# Patient Record
Sex: Male | Born: 1990 | Race: White | Hispanic: No | Marital: Single | Smoking: Never smoker
Health system: Southern US, Community
[De-identification: ages and names within clinical notes are randomized; demographics above are authoritative.]

## PROBLEM LIST (undated history)

## (undated) HISTORY — PX: OTHER SURGICAL HISTORY: SHX169

---
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
## 2010-08-29 ENCOUNTER — Ambulatory Visit: Payer: Self-pay | Admitting: Internal Medicine

## 2014-09-22 ENCOUNTER — Emergency Department
Admission: EM | Admit: 2014-09-22 | Discharge: 2014-09-22 | Disposition: A | Discharge status: Home / Self Care | Payer: Medicaid Other | Attending: Emergency Medicine | Admitting: Emergency Medicine

## 2014-09-22 ENCOUNTER — Encounter: Payer: Self-pay | Admitting: Emergency Medicine

## 2014-09-22 DIAGNOSIS — F329 Major depressive disorder, single episode, unspecified: Secondary | ICD-10-CM | POA: Insufficient documentation

## 2014-09-22 DIAGNOSIS — F32A Depression, unspecified: Secondary | ICD-10-CM

## 2014-09-22 DIAGNOSIS — F1010 Alcohol abuse, uncomplicated: Secondary | ICD-10-CM | POA: Insufficient documentation

## 2014-09-22 LAB — CBC
HEMATOCRIT: 46.1 % (ref 40.0–52.0)
HEMOGLOBIN: 15.2 g/dL (ref 13.0–18.0)
MCH: 28.9 pg (ref 26.0–34.0)
MCHC: 33.1 g/dL (ref 32.0–36.0)
MCV: 87.3 fL (ref 80.0–100.0)
Platelets: 314 10*3/uL (ref 150–440)
RBC: 5.28 MIL/uL (ref 4.40–5.90)
RDW: 12.8 % (ref 11.5–14.5)
WBC: 8.7 10*3/uL (ref 3.8–10.6)

## 2014-09-22 LAB — COMPREHENSIVE METABOLIC PANEL
ALT: 22 U/L (ref 17–63)
AST: 23 U/L (ref 15–41)
Albumin: 5.1 g/dL — ABNORMAL HIGH (ref 3.5–5.0)
Alkaline Phosphatase: 55 U/L (ref 38–126)
Anion gap: 10 (ref 5–15)
BILIRUBIN TOTAL: 1 mg/dL (ref 0.3–1.2)
BUN: 11 mg/dL (ref 6–20)
CALCIUM: 9.3 mg/dL (ref 8.9–10.3)
CHLORIDE: 104 mmol/L (ref 101–111)
CO2: 26 mmol/L (ref 22–32)
CREATININE: 1.01 mg/dL (ref 0.61–1.24)
Glucose, Bld: 108 mg/dL — ABNORMAL HIGH (ref 65–99)
Potassium: 3.9 mmol/L (ref 3.5–5.1)
Sodium: 140 mmol/L (ref 135–145)
Total Protein: 8.5 g/dL — ABNORMAL HIGH (ref 6.5–8.1)

## 2014-09-22 LAB — ETHANOL: ALCOHOL ETHYL (B): 134 mg/dL — AB (ref <5)

## 2014-09-22 LAB — ACETAMINOPHEN LEVEL

## 2014-09-22 LAB — SALICYLATE LEVEL: Salicylate Lvl: <4 mg/dL (ref 2.8–30.0)

## 2014-09-22 NOTE — ED Provider Notes (Signed)
[facility] Emergency Department Provider Note  ____________________________________________  Time seen: 6:05 AM  I have reviewed the triage vital signs and the nursing notes.   HISTORY  Chief Complaint Psychiatric Evaluation  History obtained by patient and police officer  HPI Bradley Dickerson is a 24 y.o. male who is brought to the ED under involuntary commitment by police due to suicidal ideation.  According to police, the patient was pulled over due to driving with nonfunctioning taillights. However, when he approached the vehicle they realized that the patient was intoxicated with an open container of beer on hand that he was drinking from. In the process of arresting him for a DUI, the patient made multiple statements about "not wanting to be here", and stating that he would not be in court because he would kill himself before then. The patient reports a prior history of suicide attempt but will not divulge the manner in which he tried to harm himself in the past.  The patient himself is not forthcoming with history. He states "I just want to get the f--- out of here".  Review of available records reveals that the patient was seen in an emergency department 06/25/2013 for suicidal ideation and history of self-injurious behavior and was recommended for admission to inpatient psychiatry at that time.   No past medical history on file. [employer] Patient denies any past medical history There are no active problems to display for this patient.   No past surgical history on file. Patient denies No current outpatient prescriptions on file. Patient denies Allergies Review of patient's allergies indicates no known allergies. Patient denies History reviewed. No pertinent family history.  Social History History  Substance Use Topics  . Smoking status: Never Smoker   . Smokeless tobacco: Not on file  . Alcohol Use: Not on file    Review of  Systems  Constitutional: No fever or chills. No weight changes Eyes:No blurry vision or double vision.  ENT: No sore throat. Cardiovascular: No chest pain. Respiratory: No dyspnea or cough. Gastrointestinal: Negative for abdominal pain, vomiting and diarrhea.  No BRBPR or melena. Genitourinary: Negative for dysuria, urinary retention, bloody urine, or difficulty urinating. Musculoskeletal: Negative for back pain. No joint swelling or pain. Skin: Negative for rash. Neurological: Negative for headaches, focal weakness or numbness. Psychiatric:No anxiety or depression.   Endocrine:No hot/cold intolerance, changes in energy, or sleep difficulty.  10-point ROS otherwise negative.  ____________________________________________   PHYSICAL EXAM:  VITAL SIGNS: ED Triage Vitals  Enc Vitals Group     BP 09/22/14 0538 120/79 mmHg     Pulse Rate 09/22/14 0538 86     Resp 09/22/14 0538 18     Temp 09/22/14 0538 98 F (36.7 C)     Temp src --      SpO2 09/22/14 0538 96 %     Weight --      Height --      Head Cir --      Peak Flow --      Pain Score --      Pain Loc --      Pain Edu? --      Excl. in GC? --      Constitutional: Alert and oriented. Well appearing and in no distress. Eyes: No scleral icterus. No conjunctival pallor. PERRL. EOMI ENT   Head: Normocephalic and atraumatic.   Nose: No congestion/rhinnorhea. No septal hematoma   Mouth/Throat: MMM, no pharyngeal erythema. No peritonsillar mass.  No uvula shift.   Neck: No stridor. No SubQ emphysema. No meningismus. Hematological/Lymphatic/Immunilogical: No cervical lymphadenopathy. Cardiovascular: RRR. Normal and symmetric distal pulses are present in all extremities. No murmurs, rubs, or gallops. Respiratory: Normal respiratory effort without tachypnea nor retractions. Breath sounds are clear and equal bilaterally. No wheezes/rales/rhonchi. Gastrointestinal: Soft and nontender. No distention. There is no CVA  tenderness.  No rebound, rigidity, or guarding. Genitourinary: deferred Musculoskeletal: Nontender with normal range of motion in all extremities. No joint effusions.  No lower extremity tenderness.  No edema. Neurologic:   Normal speech and language.  CN 2-10 normal. Motor grossly intact. No pronator drift.  Normal gait. No gross focal neurologic deficits are appreciated.  Skin:  Skin is warm, dry and intact. No rash noted.  No petechiae, purpura, or bullae. Psychiatric: Downtrodden affect.  ____________________________________________    LABS (pertinent positives/negatives) (all labs ordered are listed, but only abnormal results are displayed) Labs Reviewed  COMPREHENSIVE METABOLIC PANEL - Abnormal; Notable for the following:    Glucose, Bld 108 (*)    Total Protein 8.5 (*)    Albumin 5.1 (*)    All other components within normal limits  CBC  ACETAMINOPHEN LEVEL  ETHANOL  SALICYLATE LEVEL  URINE DRUG SCREEN, QUALITATIVE ([facility] ONLY)  URINALYSIS COMPLETEWITH MICROSCOPIC ([facility] ONLY)   ____________________________________________   EKG    ____________________________________________    RADIOLOGY    ____________________________________________   PROCEDURES  ____________________________________________   INITIAL IMPRESSION / ASSESSMENT AND PLAN / ED COURSE  Pertinent labs & imaging results that were available during my care of the patient were reviewed by me and considered in my medical decision making (see chart for details).  Patient presents with suicidal ideation without a stated plan. However, the patient is high risk due to engaging in risky behaviors including driving while intoxicated, and history of suicide attempt, and history of military activity and increased comfort with and access to firearms. We will continue the involuntary commitment [place] awaiting psychiatric evaluation. Patient has no acute  medical complaints and appears medically stable at this time with normal vital signs.  ____________________________________________   FINAL CLINICAL IMPRESSION(S) / ED DIAGNOSES  Final diagnoses:  Alcohol abuse  Depression      Sharman CheekPhillip Annalysse Shoemaker, MD 09/22/14 [phone]

## 2014-09-22 NOTE — ED Notes (Signed)
BEHAVIORAL HEALTH ROUNDING Patient sleeping: Yes.   Patient alert and oriented: pt sleeping Behavior appropriate: Yes.  ; If no, describe:  Nutrition and fluids offered: Yes  Toileting and hygiene offered: Yes  Sitter present: no Law enforcement present: Yes ODS

## 2014-09-22 NOTE — ED Notes (Signed)
BEHAVIORAL HEALTH ROUNDING Patient sleeping: No. Patient alert and oriented: yes Behavior appropriate: Yes.  ; If no, describe:  Nutrition and fluids offered: Yes  Toileting and hygiene offered: Yes  Sitter present: no Law enforcement present: Yes ODS 

## 2014-09-22 NOTE — ED Notes (Signed)
Patient brought in by [place] [employer] for IVC. Patient verbalized thoughts of wanting to hurt himself to [employer]. Patient with previous suicide attempt.

## 2014-09-22 NOTE — ED Notes (Signed)
Visitor with pt.

## 2014-09-22 NOTE — ED Provider Notes (Signed)
-----------------------------------------   4:07 PM on 09/22/2014 -----------------------------------------  Psychiatry has seen patient. They have rescinded the IVC. Patient will follow-up with [facility].  Phineas SemenGraydon Lauri Purdum, MD 09/22/14 [phone]

## 2014-09-22 NOTE — ED Notes (Signed)
Pt reports he has been treated for depression in the past but never on medications. Pt was stopped for a broken tail light by the [employer] and noted to be intoxicated and had an open bottle of beer in the car. Pt upon being arrested and transported to jail made several statements to the officer indicating he did not feel like he had anything to live for and that he would "not be here in 30 days".

## 2014-09-22 NOTE — ED Notes (Signed)
Pt father in with pt visiting

## 2014-09-22 NOTE — Discharge Instructions (Signed)
Please seek medical attention and help for any thoughts about wanting to harm herself, harm others, any concerning change in behavior, severe depression, inappropriate drug use or any other new or concerning symptoms. ° °Alcohol Use Disorder °Alcohol use disorder is a mental disorder. It is not a one-time incident of heavy drinking. Alcohol use disorder is the excessive and uncontrollable use of alcohol over time that leads to problems with functioning in one or more areas of daily living. People with this disorder risk harming themselves and others when they drink to excess. Alcohol use disorder also can cause other mental disorders, such as mood and anxiety disorders, and serious physical problems. People with alcohol use disorder often misuse other drugs.  °Alcohol use disorder is common and widespread. Some people with this disorder drink alcohol to cope with or escape from negative life events. Others drink to relieve chronic pain or symptoms of mental illness. People with a family history of alcohol use disorder are at higher risk of losing control and using alcohol to excess.  °SYMPTOMS  °Signs and symptoms of alcohol use disorder may include the following:  °1. Consumption of alcohol in larger amounts or over a longer period of time than intended. °2. Multiple unsuccessful attempts to cut down or control alcohol use.   °3. A great deal of time spent obtaining alcohol, using alcohol, or recovering from the effects of alcohol (hangover). °4. A strong desire or urge to use alcohol (cravings).   °5. Continued use of alcohol despite problems at work, school, or home because of alcohol use.   °6. Continued use of alcohol despite problems in relationships because of alcohol use. °7. Continued use of alcohol in situations when it is physically hazardous, such as driving a car. °8. Continued use of alcohol despite awareness of a physical or psychological problem that is likely related to alcohol use. Physical  problems related to alcohol use can involve the brain, heart, liver, stomach, and intestines. Psychological problems related to alcohol use include intoxication, depression, anxiety, psychosis, delirium, and dementia.   °9. The need for increased amounts of alcohol to achieve the same desired effect, or a decreased effect from the consumption of the same amount of alcohol (tolerance). °10. Withdrawal symptoms upon reducing or stopping alcohol use, or alcohol use to reduce or avoid withdrawal symptoms. Withdrawal symptoms include: °1. Racing heart. °2. Hand tremor. °3. Difficulty sleeping. °4. Nausea. °5. Vomiting. °6. Hallucinations. °7. Restlessness. °8. Seizures. °DIAGNOSIS °Alcohol use disorder is diagnosed through an assessment by your health care provider. Your health care provider may start by asking three or four questions to screen for excessive or problematic alcohol use. To confirm a diagnosis of alcohol use disorder, at least two symptoms must be present within a 12-month period. The severity of alcohol use disorder depends on the number of symptoms: °· Mild--two or three. °· Moderate--four or five. °· Severe--six or more. °Your health care provider may perform a physical exam or use results from lab tests to see if you have physical problems resulting from alcohol use. Your health care provider may refer you to a mental health professional for evaluation. °TREATMENT  °Some people with alcohol use disorder are able to reduce their alcohol use to low-risk levels. Some people with alcohol use disorder need to quit drinking alcohol. When necessary, mental health professionals with specialized training in substance use treatment can help. Your health care provider can help you decide how severe your alcohol use disorder is and what type of treatment you need. The following forms of   treatment are available:  °· Detoxification. Detoxification involves the use of prescription medicines to prevent alcohol  withdrawal symptoms in the first week after quitting. This is important for people with a history of symptoms of withdrawal and for heavy drinkers who are likely to have withdrawal symptoms. Alcohol withdrawal can be dangerous and, in severe cases, cause death. Detoxification is usually provided in a hospital or in-patient substance use treatment facility. °· Counseling or talk therapy. Talk therapy is provided by substance use treatment counselors. It addresses the reasons people use alcohol and ways to keep them from drinking again. The goals of talk therapy are to help people with alcohol use disorder find healthy activities and ways to cope with life stress, to identify and avoid triggers for alcohol use, and to handle cravings, which can cause relapse. °· Medicines. Different medicines can help treat alcohol use disorder through the following actions: °· Decrease alcohol cravings. °· Decrease the positive reward response felt from alcohol use. °· Produce an uncomfortable physical reaction when alcohol is used (aversion therapy). °· Support groups. Support groups are run by people who have quit drinking. They provide emotional support, advice, and guidance. °These forms of treatment are often combined. Some people with alcohol use disorder benefit from intensive combination treatment provided by specialized substance use treatment centers. Both inpatient and outpatient treatment programs are available. °Document Released: 05/14/2004 Document Revised: 08/21/2013 Document Reviewed: 07/14/2012 °ExitCare® Patient Information ©2015 [employer]. This information is not intended to replace advice given to you by your health care provider. Make sure you discuss any questions you have with your health care provider. ° °Depression °Depression refers to feeling sad, low, down in the dumps, blue, gloomy, or empty. In general, there are two kinds of depression: °11. Normal sadness or normal grief. This kind of depression  is one that we all feel from time to time after upsetting life experiences, such as the loss of a job or the ending of a relationship. This kind of depression is considered normal, is short lived, and resolves within a few days to 2 weeks. Depression experienced after the loss of a loved one (bereavement) often lasts longer than 2 weeks but normally gets better with time. °12. Clinical depression. This kind of depression lasts longer than normal sadness or normal grief or interferes with your ability to function at home, at work, and in school. It also interferes with your personal relationships. It affects almost every aspect of your life. Clinical depression is an illness. °Symptoms of depression can also be caused by conditions other than those mentioned above, such as: °· Physical illness. Some physical illnesses, including underactive thyroid gland (hypothyroidism), severe anemia, specific types of cancer, diabetes, uncontrolled seizures, heart and lung problems, strokes, and chronic pain are commonly associated with symptoms of depression. °· Side effects of some prescription medicine. In some people, certain types of medicine can cause symptoms of depression. °· Substance abuse. Abuse of alcohol and illicit drugs can cause symptoms of depression. °SYMPTOMS °Symptoms of normal sadness and normal grief include the following: °· Feeling sad or crying for short periods of time. °· Not caring about anything (apathy). °· Difficulty sleeping or sleeping too much. °· No longer able to enjoy the things you used to enjoy. °· Desire to be by oneself all the time (social isolation). °· Lack of energy or motivation. °· Difficulty concentrating or remembering. °· Change in appetite or weight. °· Restlessness or agitation. °Symptoms of clinical depression include the same symptoms of normal   sadness or normal grief and also the following symptoms: °· Feeling sad or crying all the time. °· Feelings of guilt or  worthlessness. °· Feelings of hopelessness or helplessness. °· Thoughts of suicide or the desire to harm yourself (suicidal ideation). °· Loss of touch with reality (psychotic symptoms). Seeing or hearing things that are not real (hallucinations) or having false beliefs about your life or the people around you (delusions and paranoia). °DIAGNOSIS  °The diagnosis of clinical depression is usually based on how bad the symptoms are and how long they have lasted. Your health care provider will also ask you questions about your medical history and substance use to find out if physical illness, use of prescription medicine, or substance abuse is causing your depression. Your health care provider may also order blood tests. °TREATMENT  °Often, normal sadness and normal grief do not require treatment. However, sometimes antidepressant medicine is given for bereavement to ease the depressive symptoms until they resolve. °The treatment for clinical depression depends on how bad the symptoms are but often includes antidepressant medicine, counseling with a mental health professional, or both. Your health care provider will help to determine what treatment is best for you. °Depression caused by physical illness usually goes away with appropriate medical treatment of the illness. If prescription medicine is causing depression, talk with your health care provider about stopping the medicine, decreasing the dose, or changing to another medicine. °Depression caused by the abuse of alcohol or illicit drugs goes away when you stop using these substances. Some adults need professional help in order to stop drinking or using drugs. °SEEK IMMEDIATE MEDICAL CARE IF: °· You have thoughts about hurting yourself or others. °· You lose touch with reality (have psychotic symptoms). °· You are taking medicine for depression and have a serious side effect. °FOR MORE INFORMATION °· National Alliance on Mental Illness: www.nami.org  °· National  Institute of Mental Health: www.nimh.nih.gov  °Document Released: 04/03/2000 Document Revised: 08/21/2013 Document Reviewed: 07/06/2011 °ExitCare® Patient Information ©2015 [employer]. This information is not intended to replace advice given to you by your health care provider. Make sure you discuss any questions you have with your health care provider. ° °

## 2014-09-22 NOTE — ED Notes (Signed)
BEHAVIORAL HEALTH ROUNDING  Patient sleeping: No.  Patient alert and oriented: yes  Behavior appropriate: Yes. ; If no, describe:  Nutrition and fluids offered: Yes  Toileting and hygiene offered: Yes  Sitter present: not applicable  Law enforcement present: Yes ODS  

## 2014-09-22 NOTE — BH Assessment (Signed)
Assessment Note  Bradley Dickerson is an 24 y.o. male Who presents to ER due to reporting SI, after being pulled over by laws enforcement. Pt. was charged with DWI during that time. However, is currently denying SI. He is stating he wants to be discharged from the ER with no follow up appointments or outpatient treatment. According to him, he has no problem with alcohol, due to reducing his use for the last 4 weeks. He further reports he use to drink 4 to 5 days a week, in the amount of 9 beers and 3 shots. Now he is drinking once a week, in the amount of 4, 12oz beers and 1 shot.  Pt. states, his "God Sister" was in the car, during the time he was pulled over. She didn't obtain any charges. She was "let go" and someone picked her up the location of the DWI.  Upon arrival to the ER, pt.  BAC was 134. UDS wasn't available during the time of the assessment.  Pt. also denies any involvement with the legal system, except the DWI he obtained prior to coming to the ER. He has been Inpt for his alcohol use, approximately 2 years ago. He was at [facility]. He denies any other treatment for Substance use and Mental Health.   Pt. currently denies SI/HI  Records indicate pt. has Medicaid. Writer called [employer] to verify it and to also check whether or not he is receiving Mental Health/Substance Abuse services. Pt. has not Medicaid and no formal treatment.  Axis I: Alcohol Abuse and Depressive Disorder NOS Axis III: No past medical history on file. Axis IV: economic problems, occupational problems, other psychosocial or environmental problems, problems related to legal system/crime, problems related to social environment and problems with primary support group  Past Medical History: No past medical history on file.  No past surgical history on file.  Family History: History reviewed. No pertinent family history.  Social History:  reports that he has never smoked. He does not have any  smokeless tobacco history on file. He reports that he does not use illicit drugs. His alcohol history is not on file.  Additional Social History:  Alcohol / Drug Use Pain Medications: None Reported Prescriptions: None Reported Over the Counter: None Reported History of alcohol / drug use?: Yes Longest period of sobriety (when/how long): 6 days Negative Consequences of Use: Legal Withdrawal Symptoms:  (None Reported) Substance #1 Name of Substance 1: Alcohol 1 - Age of First Use: 11 1 - Amount (size/oz): 1 shot and 4-5 (12oz) beers 1 - Frequency: Once a week 1 - Duration: Approx. 5 years 1 - Last Use / Amount: 09/22/2014  CIWA: CIWA-Ar BP: (!) 114/57 mmHg Pulse Rate: 81 COWS:    Allergies: No Known Allergies  Home Medications:  (Not in a hospital admission)  OB/GYN Status:  No LMP for male patient.  General Assessment Data Location of Assessment: [facility] ED TTS Assessment: In system Is this a Tele or Face-to-Face Assessment?: Face-to-Face Is this an Initial Assessment or a Re-assessment for this encounter?: Initial Assessment Marital status: Single Maiden name: N/A Is patient pregnant?: No (N/A) Pregnancy Status: No Living Arrangements: Non-relatives/Friends Can pt return to current living arrangement?: Yes Admission Status: Involuntary Is patient capable of signing voluntary admission?: No Referral Source: Other Mudlogger) Insurance type: Medicaid  Medical Screening Exam [facility] Walk-in ONLY) Medical Exam completed: Yes  Crisis Care Plan Living Arrangements: Non-relatives/Friends Name of Psychiatrist: N/A Name of Therapist: N/A  Education  Status Is patient currently in school?: No Current Grade: N/A Highest grade of school patient has completed: Unknown Name of school: N/A Contact person: N/A  Risk to self with the past 6 months Suicidal Ideation: No (While intoxicated pt reported SI) Has patient been a risk to self within the past 6 months prior to  admission? : Other (comment) (Pt reports driving while intoxicated) Suicidal Intent: No Has patient had any suicidal intent within the past 6 months prior to admission? : No Is patient at risk for suicide?: No Suicidal Plan?: No Has patient had any suicidal plan within the past 6 months prior to admission? : No Access to Means: No What has been your use of drugs/alcohol within the last 12 months?: Alcohol Previous Attempts/Gestures: No How many times?: 0 Other Self Harm Risks: 0 Triggers for Past Attempts: None known Intentional Self Injurious Behavior: None Family Suicide History: Unknown Recent stressful life event(s): Legal Issues Persecutory voices/beliefs?: No Depression: Yes Depression Symptoms: Feeling angry/irritable, Feeling worthless/self pity, Guilt Substance abuse history and/or treatment for substance abuse?: Yes Suicide prevention information given to non-admitted patients: Not applicable  Risk to Others within the past 6 months Homicidal Ideation: No Does patient have any lifetime risk of violence toward others beyond the six months prior to admission? : No Thoughts of Harm to Others: No Current Homicidal Intent: No Current Homicidal Plan: No Access to Homicidal Means: No Identified Victim: None Reported History of harm to others?: No Assessment of Violence: None Noted Violent Behavior Description: None Reported Does patient have access to weapons?: No Criminal Charges Pending?: Yes Describe Pending Criminal Charges: DWI Does patient have a court date: No (Unknown) Is patient on probation?: No  Psychosis Hallucinations: None noted Delusions: None noted  Mental Status Report Appearance/Hygiene: In scrubs, In hospital gown Eye Contact: Good Motor Activity: Unable to assess (Pt was laying in bed) Speech: Logical/coherent Level of Consciousness: Alert Mood: Anxious, Fearful Affect: Appropriate to circumstance, Anxious Anxiety Level: Minimal Thought  Processes: Coherent, Relevant Judgement: Unimpaired Orientation: Person, Place, Time, Situation, Appropriate for developmental age Obsessive Compulsive Thoughts/Behaviors: None  Cognitive Functioning Concentration: Normal Memory: Remote Intact, Recent Impaired IQ: Average Insight: Poor Impulse Control: Poor Appetite: Fair Weight Loss: 0 Weight Gain: 0 Sleep: No Change Total Hours of Sleep: 8 Vegetative Symptoms: None  ADLScreening [facility] Assessment Services) Patient's cognitive ability adequate to safely complete daily activities?: Yes Patient able to express need for assistance with ADLs?: Yes Independently performs ADLs?: Yes (appropriate for developmental age)  Prior Inpatient Therapy Prior Inpatient Therapy: Yes Prior Therapy Dates: 2014 [facility] Reason for Treatment: Alcohol Use  Prior Outpatient Therapy Prior Outpatient Therapy: No Prior Therapy Dates: n/a Prior Therapy Facilty/Provider(s): n/a Reason for Treatment: n/a Does patient have an ACCT team?: No Does patient have Intensive In-House Services?  : No Does patient have [facility] services? : No Does patient have P4CC services?: No  ADL Screening (condition at time of admission) Patient's cognitive ability adequate to safely complete daily activities?: Yes Patient able to express need for assistance with ADLs?: Yes Independently performs ADLs?: Yes (appropriate for developmental age)       Abuse/Neglect Assessment (Assessment to be complete while patient is alone) Physical Abuse: Denies Verbal Abuse: Denies Sexual Abuse: Denies Exploitation of patient/patient's resources: Denies Self-Neglect: Denies Values / Beliefs Cultural Requests During Hospitalization: None Spiritual Requests During Hospitalization: None Consults Spiritual Care Consult Needed: No Social Work Consult Needed: No Merchant navy officerAdvance Directives (For Healthcare) Does patient have an advance  directive?:  No Would patient like information on creating an advanced directive?: Yes - Educational materials given    Additional Information 1:1 In Past 12 Months?: No CIRT Risk: No Elopement Risk: No Does patient have medical clearance?: Yes  Child/Adolescent Assessment Running Away Risk: Denies (Pt. is an Adult)  Disposition:  Disposition Initial Assessment Completed for this Encounter: Yes Disposition of Patient: Other dispositions (Psych MD to see) Other disposition(s): Other (Comment) (Psych MD to see)  On Site Evaluation by:   Reviewed with Physician:     Lilyan Gilford, MS, LCAS, LPC, NCC, CCSI 09/22/2014 1:21 PM

## 2014-09-22 NOTE — Consult Note (Signed)
[facility] Consult   Reason for Consult:  Follow up Referring Physician:  ER Patient Identification: Bradley Dickerson MRN:  [id number] Principal Diagnosis: <principal problem not specified> Diagnosis:  There are no active problems to display for this patient.   Total Time spent with patient: 45 minutes  Subjective:   Bradley Dickerson is a 24 y.o. 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 HPI Elements:     Past Medical History: No past medical history on file. No past surgical history on file. Family History: History reviewed. No pertinent family history. Social History:  History  Alcohol Use: Not on file     History  Drug Use No    History   Social History  . Marital Status: Single    Spouse Name: N/A  . Number of Children: N/A  . Years of Education: N/A   Social History Main Topics  . Smoking status: Never Smoker   . Smokeless tobacco: Not on file  . Alcohol Use: Not on file  . Drug Use: No  . Sexual Activity: Not on file   Other Topics Concern  . None   Social History Narrative  . None   Additional Social History:    Pain Medications: None Reported Prescriptions: None Reported Over the Counter: None Reported History of alcohol / drug use?: Yes Longest period of sobriety (when/how long): 6 days Negative Consequences of Use: Legal Withdrawal Symptoms:  (None Reported) Name of Substance 1: Alcohol 1 - Age of First Use: 11 1 - Amount (size/oz): 1 shot and 4-5 (12oz) beers 1 - Frequency: Once a week 1 - Duration: Approx. 5 years 1 - Last Use / Amount: 09/22/2014                   Allergies:  No Known Allergies  Labs:  Results for orders placed or performed during the hospital encounter of 09/22/14 (from the past  48 hour(s))  Acetaminophen level     Status: Abnormal   Collection Time: 09/22/14  5:43 AM  Result Value Ref Range   Acetaminophen (Tylenol), Serum <10 (L) 10 - 30 ug/mL    Comment:        THERAPEUTIC CONCENTRATIONS VARY SIGNIFICANTLY. A RANGE OF 10-30 ug/mL MAY BE AN EFFECTIVE CONCENTRATION FOR MANY PATIENTS. HOWEVER, SOME ARE BEST TREATED AT CONCENTRATIONS OUTSIDE THIS RANGE. ACETAMINOPHEN CONCENTRATIONS >150 ug/mL AT 4 HOURS AFTER INGESTION AND >50 ug/mL AT 12 HOURS AFTER INGESTION ARE OFTEN ASSOCIATED WITH TOXIC REACTIONS.   CBC     Status: None   Collection Time: 09/22/14  5:43 AM  Result Value Ref Range   WBC 8.7 3.8 - 10.6 K/uL   RBC 5.28 4.40 - 5.90 MIL/uL   Hemoglobin 15.2 13.0 - 18.0 g/dL   HCT 46.1 40.0 - 52.0 %   MCV 87.3 80.0 - 100.0 fL   MCH 28.9 26.0 - 34.0 pg   MCHC 33.1 32.0 - 36.0 g/dL   RDW 12.8 11.5 - 14.5 %   Platelets 314 150 - 440 K/uL  Comprehensive metabolic panel     Status: Abnormal   Collection Time: 09/22/14  5:43 AM  Result Value Ref Range   Sodium 140 135 - 145 mmol/L  Potassium 3.9 3.5 - 5.1 mmol/L   Chloride 104 101 - 111 mmol/L   CO2 26 22 - 32 mmol/L   Glucose, Bld 108 (H) 65 - 99 mg/dL   BUN 11 6 - 20 mg/dL   Creatinine, Ser 1.01 0.61 - 1.24 mg/dL   Calcium 9.3 8.9 - 10.3 mg/dL   Total Protein 8.5 (H) 6.5 - 8.1 g/dL   Albumin 5.1 (H) 3.5 - 5.0 g/dL   AST 23 15 - 41 U/L   ALT 22 17 - 63 U/L   Alkaline Phosphatase 55 38 - 126 U/L   Total Bilirubin 1.0 0.3 - 1.2 mg/dL   GFR calc non Af Amer >60 >60 mL/min   GFR calc Af Amer >60 >60 mL/min    Comment: (NOTE) The eGFR has been calculated using the CKD EPI equation. This calculation has not been validated in all clinical situations. eGFR's persistently <60 mL/min signify possible Chronic Kidney Disease.    Anion gap 10 5 - 15  Ethanol (ETOH)     Status: Abnormal   Collection Time: 09/22/14  5:43 AM  Result Value Ref Range   Alcohol, Ethyl (B) 134 (H) <5 mg/dL    Comment:         LOWEST DETECTABLE LIMIT FOR SERUM ALCOHOL IS 11 mg/dL FOR MEDICAL PURPOSES ONLY   Salicylate level     Status: None   Collection Time: 09/22/14  5:43 AM  Result Value Ref Range   Salicylate Lvl <6.3 2.8 - 30.0 mg/dL    Vitals: Blood pressure 114/57, pulse 81, temperature 97.8 F (36.6 C), temperature source Oral, resp. rate 18, SpO2 97 %.  Risk to Self: Suicidal Ideation: No (While intoxicated pt reported SI) Suicidal Intent: No Is patient at risk for suicide?: No Suicidal Plan?: No Access to Means: No What has been your use of drugs/alcohol within the last 12 months?: Alcohol How many times?: 0 Other Self Harm Risks: 0 Triggers for Past Attempts: None known Intentional Self Injurious Behavior: None Risk to Others: Homicidal Ideation: No Thoughts of Harm to Others: No Current Homicidal Intent: No Current Homicidal Plan: No Access to Homicidal Means: No Identified Victim: None Reported History of harm to others?: No Assessment of Violence: None Noted Violent Behavior Description: None Reported Does patient have access to weapons?: No Criminal Charges Pending?: Yes Describe Pending Criminal Charges: DWI Does patient have a court date: No (Unknown) Prior Inpatient Therapy: Prior Inpatient Therapy: Yes Prior Therapy Dates: 2014 Prior Therapy Facilty/Provider(s): [facility] Reason for Treatment: Alcohol Use Prior Outpatient Therapy: Prior Outpatient Therapy: No Prior Therapy Dates: n/a Prior Therapy Facilty/Provider(s): n/a Reason for Treatment: n/a Does patient have an ACCT team?: No Does patient have Intensive In-House Services?  : No Does patient have [facility] services? : No Does patient have P4CC services?: No  No current facility-administered medications for this encounter.   No current outpatient prescriptions on file.    Musculoskeletal: Strength & Muscle Tone: within normal limits Gait & Station: normal Patient leans: N/A  Psychiatric Specialty  Exam: Physical Exam  Review of Systems  Constitutional: Negative.   HENT: Negative.   Eyes: Negative.   Respiratory: Negative.   Cardiovascular: Negative.   Gastrointestinal: Negative.   Genitourinary: Negative.   Musculoskeletal: Negative.   Skin: Negative.   Neurological: Negative.   Endo/Heme/Allergies: Negative.   Psychiatric/Behavioral: Positive for substance abuse.    Blood pressure 114/57, pulse 81, temperature 97.8 F (36.6 C), temperature source Oral, resp. rate 18, SpO2 97 %.There  is no height or weight on file to calculate BMI.  General Appearance: Casual  Eye Contact::  Good  Speech:  Clear and Coherent  Volume:  Normal  Mood:  Anxious  Affect:  Appropriate  Thought Process:  Circumstantial  Orientation:  Full (Time, Place, and Person)  Thought Content:  Negative  Suicidal Thoughts:  No  Homicidal Thoughts:  No  Memory:  Good and intact.  Judgement:  Fair  Insight:  Fair  Psychomotor Activity:  Normal  Concentration:  Good  Recall:  [employer] of Knowledge:Fair  Language: Fair  Akathisia:  No  Handed:  Right  AIMS (if indicated):     Assets:  Communication Skills  ADL's:  Intact  Cognition: WNL  Sleep:      Medical Decision Making: Established Problem, Stable/Improving (1)  Treatment Plan Summary: Plan \D/C IVC and D/C pt with follow up and help with Substance abuse  Plan:  No evidence of imminent risk to self or others at present.   Disposition: as above  Cannen Dupras K 09/22/2014 1:51 PM

## 2014-09-22 NOTE — ED Notes (Signed)
BEHAVIORAL HEALTH ROUNDING Patient sleeping: Yes.   Patient alert and oriented: yes Behavior appropriate: Yes.  ;  Nutrition and fluids offered: Yes  Toileting and hygiene offered: Yes  Sitter present: yes Law enforcement present: Yes  

## 2014-09-22 NOTE — ED Notes (Signed)

## 2014-09-22 NOTE — ED Notes (Signed)
Psychiatrist in with pt.

## 2014-09-22 NOTE — ED Notes (Signed)
Pt given dinner tray at this time, pt laying in bed not eating.

## 2014-12-31 ENCOUNTER — Emergency Department
Admission: EM | Admit: 2014-12-31 | Discharge: 2014-12-31 | Disposition: A | Discharge status: Home / Self Care | Payer: Medicaid Other | Attending: Emergency Medicine | Admitting: Emergency Medicine

## 2014-12-31 ENCOUNTER — Encounter: Payer: Self-pay | Admitting: Emergency Medicine

## 2014-12-31 DIAGNOSIS — Y9289 Other specified places as the place of occurrence of the external cause: Secondary | ICD-10-CM | POA: Insufficient documentation

## 2014-12-31 DIAGNOSIS — X58XXXA Exposure to other specified factors, initial encounter: Secondary | ICD-10-CM | POA: Insufficient documentation

## 2014-12-31 DIAGNOSIS — S30812A Abrasion of penis, initial encounter: Secondary | ICD-10-CM | POA: Insufficient documentation

## 2014-12-31 DIAGNOSIS — T148XXA Other injury of unspecified body region, initial encounter: Secondary | ICD-10-CM

## 2014-12-31 DIAGNOSIS — Y998 Other external cause status: Secondary | ICD-10-CM | POA: Insufficient documentation

## 2014-12-31 DIAGNOSIS — N4822 Cellulitis of corpus cavernosum and penis: Secondary | ICD-10-CM | POA: Insufficient documentation

## 2014-12-31 DIAGNOSIS — L03818 Cellulitis of other sites: Secondary | ICD-10-CM

## 2014-12-31 DIAGNOSIS — Y9389 Activity, other specified: Secondary | ICD-10-CM | POA: Insufficient documentation

## 2014-12-31 MED ORDER — AZITHROMYCIN 250 MG PO TABS
1000.0000 mg | ORAL_TABLET | Freq: Once | ORAL | Status: AC
  Administered 2014-12-31: 1000 mg via ORAL
  Filled 2014-12-31: qty 4

## 2014-12-31 MED ORDER — MUPIROCIN 2 % EX OINT: TOPICAL_OINTMENT | CUTANEOUS | Status: AC

## 2014-12-31 MED ORDER — CEFTRIAXONE SODIUM 250 MG IJ SOLR
250.0000 mg | INTRAMUSCULAR | Status: DC
  Administered 2014-12-31: 250 mg via INTRAMUSCULAR
  Filled 2014-12-31: qty 250

## 2014-12-31 NOTE — ED Notes (Signed)
States girl friend has been treated for std  Now having discharge

## 2014-12-31 NOTE — Discharge Instructions (Signed)
Abrasion An abrasion is a cut or scrape of the skin. Abrasions do not extend through all layers of the skin and most heal within 10 days. It is important to care for your abrasion properly to prevent infection. CAUSES  Most abrasions are caused by falling on, or gliding across, the ground or other surface. When your skin rubs on something, the outer and inner layer of skin rubs off, causing an abrasion. DIAGNOSIS  Your caregiver will be able to diagnose an abrasion during a physical exam.  TREATMENT  Your treatment depends on how large and deep the abrasion is. Generally, your abrasion will be cleaned with water and a mild soap to remove any dirt or debris. An antibiotic ointment may be put over the abrasion to prevent an infection. A bandage (dressing) may be wrapped around the abrasion to keep it from getting dirty.  You may need a tetanus shot if:  You cannot remember when you had your last tetanus shot.  You have never had a tetanus shot.  The injury broke your skin. If you get a tetanus shot, your arm may swell, get red, and feel warm to the touch. This is common and not a problem. If you need a tetanus shot and you choose not to have one, there is a rare chance of getting tetanus. Sickness from tetanus can be serious.  HOME CARE INSTRUCTIONS   If a dressing was applied, change it at least once a day or as directed by your caregiver. If the bandage sticks, soak it off with warm water.   Wash the area with water and a mild soap to remove all the ointment 2 times a day. Rinse off the soap and pat the area dry with a clean towel.   Reapply any ointment as directed by your caregiver. This will help prevent infection and keep the bandage from sticking. Use gauze over the wound and under the dressing to help keep the bandage from sticking.   Change your dressing right away if it becomes wet or dirty.   Only take over-the-counter or prescription medicines for pain, discomfort, or fever as  directed by your caregiver.   Follow up with your caregiver within 24-48 hours for a wound check, or as directed. If you were not given a wound-check appointment, look closely at your abrasion for redness, swelling, or pus. These are signs of infection. SEEK IMMEDIATE MEDICAL CARE IF:   You have increasing pain in the wound.   You have redness, swelling, or tenderness around the wound.   You have pus coming from the wound.   You have a fever or persistent symptoms for more than 2-3 days.  You have a fever and your symptoms suddenly get worse.  You have a bad smell coming from the wound or dressing.  MAKE SURE YOU:   Understand these instructions.  Will watch your condition.  Will get help right away if you are not doing well or get worse. Document Released: 01/14/2005 Document Revised: 03/23/2012 Document Reviewed: 03/10/2011 [facility] Patient Information 2015 [place], [place]. This information is not intended to replace advice given to you by your health care provider. Make sure you discuss any questions you have with your health care provider.  Cellulitis Cellulitis is an infection of the skin and the tissue under the skin. The infected area is usually red and tender. This happens most often in the arms and lower legs. HOME CARE   Take your antibiotic medicine as told. Finish the medicine  even if you start to feel better.  Keep the infected arm or leg raised (elevated).  Put a warm cloth on the area up to 4 times per day.  Only take medicines as told by your doctor.  Keep all doctor visits as told. GET HELP IF:  You see red streaks on the skin coming from the infected area.  Your red area gets bigger or turns a dark color.  Your bone or joint under the infected area is painful after the skin heals.  Your infection comes back in the same area or different area.  You have a puffy (swollen) bump in the infected area.  You have new symptoms.  You have a  fever. GET HELP RIGHT AWAY IF:   You feel very sleepy.  You throw up (vomit) or have watery poop (diarrhea).  You feel sick and have muscle aches and pains. MAKE SURE YOU:   Understand these instructions.  Will watch your condition.  Will get help right away if you are not doing well or get worse. Document Released: 09/23/2007 Document Revised: 08/21/2013 Document Reviewed: 06/22/2011 [facility] Patient Information 2015 [place], [place]. This information is not intended to replace advice given to you by your health care provider. Make sure you discuss any questions you have with your health care provider.

## 2014-12-31 NOTE — ED Notes (Signed)
States he has been exposed to STD   Positive discharge

## 2014-12-31 NOTE — ED Provider Notes (Signed)
[facility] Emergency Department Provider Note     Time seen: ----------------------------------------- 11:44 AM on 12/31/2014 -----------------------------------------    I have reviewed the triage vital signs and the nursing notes.   HISTORY  Chief Complaint Penile Discharge    HPI Bradley Dickerson is a 24 y.o. male who presents ER for examination concerning possibly having an STD. Patient states that on protected sex, also recently used a condom. Has a red tender lesion on his penile shaft is concerned about. He does not have any dysuria   History reviewed. No pertinent past medical history.  There are no active problems to display for this patient.   History reviewed. No pertinent past surgical history.  Allergies Review of patient's allergies indicates no known allergies.  Social History Social History  Substance Use Topics  . Smoking status: Never Smoker   . Smokeless tobacco: None  . Alcohol Use: None    Review of Systems Constitutional: Negative for fever. Genitourinary: Negative for dysuria. Positive for redness and swelling to the penile shaft Skin: Redness to the penile shaft  ____________________________________________   PHYSICAL EXAM:  VITAL SIGNS: ED Triage Vitals  Enc Vitals Group     BP 12/31/14 1140 146/78 mmHg     Pulse Rate 12/31/14 1140 86     Resp 12/31/14 1140 20     Temp 12/31/14 1140 98.2 F (36.8 C)     Temp Source 12/31/14 1140 Oral     SpO2 12/31/14 1140 99 %     Weight 12/31/14 1140 130 lb (58.968 kg)     Height 12/31/14 1140  (1.676 m)     Head Cir --      Peak Flow --      Pain Score --      Pain Loc --      Pain Edu? --      Excl. in GC? --     Constitutional: Alert and oriented. Well appearing and in no distress. Genitourinary: Patient has small inguinal lymph nodes on the left, there is a red erythematous lesion consistent with abrasion and mild cellulitis to the penile shaft on the  right. Skin:  Abrasion with small area of cellulitis to the penile shaft with slight edema  ____________________________________________  ED COURSE:  Pertinent labs & imaging results that were available during my care of the patient were reviewed by me and considered in my medical decision making (see chart for details). Findings not consistent with syphilis, however consistent with abrasion with likely bacterial infection. He has reactive adenopathy from same patient is requesting coverage for STD. ____________________________________________  FINAL ASSESSMENT AND PLAN  Penile abrasion, cellulitis  Plan: Patient with labs and imaging as dictated above. Patient was given Rocephin and Zithromax to cover for gonorrhea and chlamydia. We placed on Bactroban ointment to apply to his penis. He is advised to follow-up in 2 days for recheck.   Emily Filbert, MD   Emily Filbert, MD 12/31/14 [phone]

## 2016-06-25 ENCOUNTER — Emergency Department: Payer: Self-pay

## 2016-06-25 ENCOUNTER — Emergency Department
Admission: EM | Admit: 2016-06-25 | Discharge: 2016-06-25 | Disposition: A | Discharge status: Home / Self Care | Payer: Self-pay | Attending: Emergency Medicine | Admitting: Emergency Medicine

## 2016-06-25 DIAGNOSIS — K5790 Diverticulosis of intestine, part unspecified, without perforation or abscess without bleeding: Secondary | ICD-10-CM | POA: Insufficient documentation

## 2016-06-25 DIAGNOSIS — R1032 Left lower quadrant pain: Secondary | ICD-10-CM

## 2016-06-25 LAB — COMPREHENSIVE METABOLIC PANEL
ALBUMIN: 4.3 g/dL (ref 3.5–5.0)
ALT: 16 U/L — ABNORMAL LOW (ref 17–63)
AST: 18 U/L (ref 15–41)
Alkaline Phosphatase: 49 U/L (ref 38–126)
Anion gap: 6 (ref 5–15)
BILIRUBIN TOTAL: 0.9 mg/dL (ref 0.3–1.2)
BUN: 13 mg/dL (ref 6–20)
CO2: 27 mmol/L (ref 22–32)
Calcium: 9.1 mg/dL (ref 8.9–10.3)
Chloride: 106 mmol/L (ref 101–111)
Creatinine, Ser: 0.98 mg/dL (ref 0.61–1.24)
GFR calc Af Amer: >60 mL/min (ref >60)
GFR calc non Af Amer: >60 mL/min (ref >60)
GLUCOSE: 87 mg/dL (ref 65–99)
POTASSIUM: 4.3 mmol/L (ref 3.5–5.1)
SODIUM: 139 mmol/L (ref 135–145)
TOTAL PROTEIN: 7.5 g/dL (ref 6.5–8.1)

## 2016-06-25 LAB — CBC
HEMATOCRIT: 47.6 % (ref 40.0–52.0)
HEMOGLOBIN: 16 g/dL (ref 13.0–18.0)
MCH: 29.2 pg (ref 26.0–34.0)
MCHC: 33.7 g/dL (ref 32.0–36.0)
MCV: 86.6 fL (ref 80.0–100.0)
Platelets: 279 10*3/uL (ref 150–440)
RBC: 5.5 MIL/uL (ref 4.40–5.90)
RDW: 13 % (ref 11.5–14.5)
WBC: 9.6 10*3/uL (ref 3.8–10.6)

## 2016-06-25 LAB — URINALYSIS, COMPLETE (UACMP) WITH MICROSCOPIC
BILIRUBIN URINE: NEGATIVE
Bacteria, UA: NONE SEEN
Glucose, UA: NEGATIVE mg/dL
HGB URINE DIPSTICK: NEGATIVE
KETONES UR: NEGATIVE mg/dL
Leukocytes, UA: NEGATIVE
NITRITE: NEGATIVE
PROTEIN: NEGATIVE mg/dL
Specific Gravity, Urine: 1.013 (ref 1.005–1.030)
pH: 5 (ref 5.0–8.0)

## 2016-06-25 LAB — LIPASE, BLOOD: Lipase: 20 U/L (ref 11–51)

## 2016-06-25 MED ORDER — SODIUM CHLORIDE 0.9 % IV BOLUS (SEPSIS)
1000.0000 mL | Freq: Once | INTRAVENOUS | Status: AC
  Administered 2016-06-25: 1000 mL via INTRAVENOUS

## 2016-06-25 MED ORDER — METRONIDAZOLE 500 MG PO TABS: 500.0000 mg | ORAL_TABLET | Freq: Three times a day (TID) | ORAL | 0 refills | Status: AC

## 2016-06-25 MED ORDER — CIPROFLOXACIN HCL 500 MG PO TABS: 500.0000 mg | ORAL_TABLET | Freq: Two times a day (BID) | ORAL | 0 refills | Status: AC

## 2016-06-25 MED ORDER — IOPAMIDOL (ISOVUE-300) INJECTION 61%
100.0000 mL | Freq: Once | INTRAVENOUS | Status: AC | PRN
  Administered 2016-06-25: 100 mL via INTRAVENOUS

## 2016-06-25 MED ORDER — CIPROFLOXACIN HCL 500 MG PO TABS
500.0000 mg | ORAL_TABLET | Freq: Once | ORAL | Status: AC
  Administered 2016-06-25: 500 mg via ORAL
  Filled 2016-06-25: qty 1

## 2016-06-25 MED ORDER — OXYCODONE-ACETAMINOPHEN 5-325 MG PO TABS: 1.0000 | ORAL_TABLET | Freq: Four times a day (QID) | ORAL | 0 refills | Status: DC | PRN

## 2016-06-25 MED ORDER — METRONIDAZOLE 500 MG PO TABS
500.0000 mg | ORAL_TABLET | Freq: Once | ORAL | Status: AC
  Administered 2016-06-25: 500 mg via ORAL
  Filled 2016-06-25: qty 1

## 2016-06-25 MED ORDER — IOPAMIDOL (ISOVUE-300) INJECTION 61%
30.0000 mL | Freq: Once | INTRAVENOUS | Status: AC | PRN
  Administered 2016-06-25: 30 mL via ORAL

## 2016-06-25 NOTE — ED Notes (Signed)
Patient transported to CT 

## 2016-06-25 NOTE — ED Triage Notes (Signed)
Pt c/o LLQ pain since yesterday with intermittent dizziness. Denies N/V/D.Marland Kitchen. States area is painful with palpation.

## 2016-06-25 NOTE — ED Provider Notes (Signed)
[facility] Emergency Department Provider Note  ____________________________________________   First MD Initiated Contact with Patient 06/25/16 1205     (approximate)  I have reviewed the triage vital signs and the nursing notes.   HISTORY  Chief Complaint Abdominal Pain   HPI Bradley Dickerson is a 26 y.o. male with a history of diverticulitis was presenting with left lower quadrant abdominal pain. He says the pain is been ongoing since yesterday and is constant, sharp and stabbing. He reports it is a 69 out of 10 at this time. Denies any nausea vomiting or diarrhea. Says he thought he was constipated yesterday. Says that he one normal bowel movement earlier in the day yesterday and then thought he had to move his bowels at night and was unable to. He says the pain has been constant since then.  Denies any pain in his pain a testicles. Denies any pain or burning with urination.   History reviewed. No pertinent past medical history.  There are no active problems to display for this patient.   History reviewed. No pertinent surgical history.  Prior to Admission medications   Not on File    Allergies Patient has no known allergies.  No family history on file.  Social History Social History  Substance Use Topics  . Smoking status: Never Smoker  . Smokeless tobacco: Never Used  . Alcohol use No    Review of Systems Constitutional: No fever/chills Eyes: No visual changes. ENT: No sore throat. Cardiovascular: Denies chest pain. Respiratory: Denies shortness of breath. Gastrointestinal: No nausea, no vomiting.  No diarrhea.  No constipation. Genitourinary: Negative for dysuria. Musculoskeletal: Negative for back pain. Skin: Negative for rash. Neurological: Negative for headaches, focal weakness or numbness.  10-point ROS otherwise negative.  ____________________________________________   PHYSICAL EXAM:  VITAL SIGNS: ED Triage Vitals    Enc Vitals Group     BP 06/25/16 1046 139/77     Pulse Rate 06/25/16 1046 80     Resp 06/25/16 1046 20     Temp 06/25/16 1046 98.3 F (36.8 C)     Temp Source 06/25/16 1046 Oral     SpO2 06/25/16 1046 100 %     Weight 06/25/16 1046 135 lb (61.2 kg)     Height 06/25/16 1046 5\' 6"  (1.676 m)     Head Circumference --      Peak Flow --      Pain Score 06/25/16 1052 2     Pain Loc --      Pain Edu? --      Excl. in [place]? --     Constitutional: Alert and oriented. Well appearing and in no acute distress. Eyes: Conjunctivae are normal. PERRL. EOMI. Head: Atraumatic. Nose: No congestion/rhinnorhea. Mouth/Throat: Mucous membranes are moist.  Neck: No stridor.   Cardiovascular: Normal rate, regular rhythm. Grossly normal heart sounds.  Respiratory: Normal respiratory effort.  No retractions. Lungs CTAB. Gastrointestinal: Soft With moderate left lower quadrant tenderness to palpation. No right lower quadrant tenderness over McBurney's point. Negative Murphy sign. No CVA tenderness. Musculoskeletal: No lower extremity tenderness nor edema.  No joint effusions. Neurologic:  Normal speech and language. No gross focal neurologic deficits are appreciated.  Skin:  Skin is warm, dry and intact. No rash noted. Psychiatric: Mood and affect are normal. Speech and behavior are normal.  ____________________________________________   LABS (all labs ordered are listed, but only abnormal results are displayed)  Labs Reviewed  COMPREHENSIVE METABOLIC PANEL - Abnormal; Notable  for the following:       Result Value   ALT 16 (*)    All other components within normal limits  URINALYSIS, COMPLETE (UACMP) WITH MICROSCOPIC - Abnormal; Notable for the following:    Color, Urine YELLOW (*)    APPearance CLEAR (*)    Squamous Epithelial / LPF 0-5 (*)    All other components within normal limits  LIPASE, BLOOD  CBC    ____________________________________________  EKG   ____________________________________________  RADIOLOGY  CT Abdomen Pelvis W Contrast (Final result)  Result time 06/25/16 14:40:56  Final result by Natasha Mead, MD (06/25/16 14:40:56)           Narrative:   CLINICAL DATA: Left lower quadrant pain, possible diverticulitis  EXAM: CT ABDOMEN AND PELVIS WITH CONTRAST  TECHNIQUE: Multidetector CT imaging of the abdomen and pelvis was performed using the standard protocol following bolus administration of intravenous contrast.  CONTRAST: ISOVUE-300 IOPAMIDOL (ISOVUE-300) INJECTION 61%  COMPARISON: CT scan 08/29/2010  FINDINGS: Lower chest: Lung bases shows no acute findings.  Hepatobiliary: No focal liver abnormality is seen. No gallstones, gallbladder wall thickening, or biliary dilatation.  Pancreas: Unremarkable. No pancreatic ductal dilatation or surrounding inflammatory changes.  Spleen: Normal in size without focal abnormality.  Adrenals/Urinary Tract: Adrenal glands are unremarkable. Kidneys are normal, without renal calculi, focal lesion, or hydronephrosis. Bladder is unremarkable.  Stomach/Bowel: Oral contrast material was given to the patient. There is no gastric outlet obstruction. No small bowel obstruction. No thickened or dilated small bowel loops. No pericecal inflammation. Moderate stool noted within cecum. Normal appendix noted in axial image 46. There is moderate stool within distal sigmoid colon and rectum. Mild distension of the rectum with some colonic stool and gas up to 4.8 cm.  Few diverticula are noted distal descending colon. There is no definite evidence of acute diverticulitis or acute colitis. In axial image 46 there is focal ring like stranding of mesenteric fat anterior to sigmoid colon. This measures about 1.7 cm best seen in sagittal image 85. This is highly suspicious for mesenteric appendagitis or focal panniculitis.  There is no evidence of fat necrosis or mesenteric abscess. Less likely mild acute diverticulitis.  Vascular/Lymphatic: No aortic aneurysm. No adenopathy.  Reproductive: Prostate gland and seminal vesicles are unremarkable. The urinary bladder is unremarkable.  Other: No ascites or free abdominal air. No inguinal adenopathy.  Musculoskeletal: No destructive bony lesions are noted.  IMPRESSION: 1. Few diverticula are noted distal descending colon. There is no definite evidence of acute diverticulitis or acute colitis. In axial image 46 there is focal ring like stranding of mesenteric fat anterior to sigmoid colon. This measures about 1.7 cm best seen in sagittal image 85. This is highly suspicious for mesenteric appendagitis or focal panniculitis. There is no evidence of fat necrosis or mesenteric abscess. 2. No hydronephrosis or hydroureter. 3. No ascites or free abdominal air. No small bowel obstruction. 4. Normal appendix. No pericecal inflammation. 5. Moderate stool noted within cecum. Moderate stool noted within distal sigmoid colon and rectum. Mild dilatation of the rectum with gas and some stool up to 4.8 cm.   Electronically Signed By: Natasha Mead M.D. On: 06/25/2016 14:40          ____________________________________________   PROCEDURES  Procedure(s) performed:   Procedures  Critical Care performed:   ____________________________________________   INITIAL IMPRESSION / ASSESSMENT AND PLAN / ED COURSE  Pertinent labs & imaging results that were available during my care of the patient were reviewed  by me and considered in my medical decision making (see chart for details).  Patient offered but denies wanting any pain medication at this time.    ----------------------------------------- 3:06 PM on 06/25/2016 -----------------------------------------  Patient resting comfortably at this time. Indeterminate CT with multiple possible issues.  Discussed the read with Dr. Excell Seltzer of surgery who recommends antibiotics because of the inflammatory changes. I discussed this with the patient and he is understanding and willing to comply. He'll be discharged on Cipro and Flagyl and also as needed Percocet. Patient will follow up with primary care. To return for any worsening or concerning symptoms.   ____________________________________________   FINAL CLINICAL IMPRESSION(S) / ED DIAGNOSES  Left lower quadrant abdominal pain. Diverticulosis.    NEW MEDICATIONS STARTED DURING THIS VISIT:  New Prescriptions   No medications on file     Note:  This document was prepared using Dragon voice recognition software and may include unintentional dictation errors.    Myrna Blazer, MD 06/25/16 [phone]

## 2018-12-01 ENCOUNTER — Other Ambulatory Visit: Payer: Self-pay

## 2018-12-01 ENCOUNTER — Encounter: Payer: Self-pay | Admitting: Emergency Medicine

## 2018-12-01 ENCOUNTER — Ambulatory Visit
Admission: EM | Admit: 2018-12-01 | Discharge: 2018-12-01 | Disposition: A | Discharge status: Home / Self Care | Payer: Self-pay | Attending: Family Medicine | Admitting: Family Medicine

## 2018-12-01 DIAGNOSIS — Z202 Contact with and (suspected) exposure to infections with a predominantly sexual mode of transmission: Secondary | ICD-10-CM

## 2018-12-01 MED ORDER — AZITHROMYCIN 500 MG PO TABS
1000.0000 mg | ORAL_TABLET | Freq: Once | ORAL | Status: AC
  Administered 2018-12-01: 18:00:00 1000 mg via ORAL

## 2018-12-01 NOTE — Discharge Instructions (Signed)
We will call with results.  Take care  Dr. Breck Hollinger  

## 2018-12-01 NOTE — ED Provider Notes (Signed)
[facility]    CSN: [id number] Arrival date & time: 12/01/18  1641   History   Chief Complaint Chief Complaint  Patient presents with  . Exposure to STD   HPI  28 year old male presents with the above complaint.  Patient reports that he was recently informed by his sexual partner that she tested positive for chlamydia.  He states that he is feeling well.  He denies penile discharge.  No dysuria.  He does note that he has some "red spots" below the head of his penis.  They are not painful.  He just noticed them today.  Patient states that he desires testing and treatment today.  No other associated symptoms.  No other complaints.  PMH, Surgical Hx, Family Hx, Social History reviewed and updated as below.  PMH: Hx of alcohol abuse  Past Surgical History:  Procedure Laterality Date  . NO PAST SURGERIES      Home Medications    Prior to Admission medications   Not on File    Family History Family History  Problem Relation Age of Onset  . Hyperlipidemia Mother     Social History Social History   Tobacco Use  . Smoking status: Never Smoker  . Smokeless tobacco: Never Used  Substance Use Topics  . Alcohol use: Yes    Alcohol/week: 6.0 standard drinks    Types: 6 Cans of beer per week  . Drug use: No     Allergies   Patient has no known allergies.   Review of Systems Review of Systems  Constitutional: Negative.   Genitourinary: Positive for genital sores. Negative for discharge and dysuria.   Physical Exam Triage Vital Signs ED Triage Vitals  Enc Vitals Group     BP 12/01/18 1706 131/90     Pulse Rate 12/01/18 1706 79     Resp 12/01/18 1706 18     Temp 12/01/18 1706 98.3 F (36.8 C)     Temp Source 12/01/18 1706 Oral     SpO2 12/01/18 1706 100 %     Weight 12/01/18 1703 160 lb (72.6 kg)     Height 12/01/18 1703 5\' 6"  (1.676 m)     Head Circumference --      Peak Flow --      Pain Score 12/01/18 1703 0     Pain Loc --      Pain Edu? --       Excl. in GC? --    Updated Vital Signs BP 131/90 (BP Location: Left Arm)   Pulse 79   Temp 98.3 F (36.8 C) (Oral)   Resp 18   Ht 5\' 6"  (1.676 m)   Wt 72.6 kg   SpO2 100%   BMI 25.82 kg/m   Visual Acuity Right Eye Distance:   Left Eye Distance:   Bilateral Distance:    Right Eye Near:   Left Eye Near:    Bilateral Near:     Physical Exam Vitals signs and nursing note reviewed.  Constitutional:      General: He is not in acute distress.    Appearance: Normal appearance.  HENT:     Head: Normocephalic and atraumatic.  Eyes:     General:        Right eye: No discharge.        Left eye: No discharge.     Conjunctiva/sclera: Conjunctivae normal.  Pulmonary:     Effort: Pulmonary effort is normal. No respiratory distress.  Genitourinary:  Scrotum/Testes: Normal.       Comments: Scattered erythematous papules noted below the head of the penis. Neurological:     Mental Status: He is alert.  Psychiatric:        Mood and Affect: Mood normal.        Behavior: Behavior normal.    UC Treatments / Results  Labs (all labs ordered are listed, but only abnormal results are displayed) Labs Reviewed  GC/CHLAMYDIA PROBE AMP  HERPES SIMPLEX VIRUS(HSV) DNA BY PCR    EKG   Radiology No results found.  Procedures Procedures (including critical care time)  Medications Ordered in UC Medications  azithromycin (ZITHROMAX) tablet 1,000 mg (1,000 mg Oral Given 12/01/18 1748)    Initial Impression / Assessment and Plan / UC Course  I have reviewed the triage vital signs and the nursing notes.  Pertinent labs & imaging results that were available during my care of the patient were reviewed by me and considered in my medical decision making (see chart for details).    28 year old male presents with STD exposure.  HSV PCR done today.  Awaiting GC and chlamydia.  Treating empirically for chlamydia with Azithromycin.  Final Clinical Impressions(s) / UC Diagnoses    Final diagnoses:  Exposure to STD     Discharge Instructions     We will call with results.  Take care  Dr. Lacinda Axon   ED Prescriptions    None     Controlled Substance Prescriptions New Site Controlled Substance Registry consulted? Not Applicable   Coral Spikes, DO 12/01/18 1814

## 2018-12-01 NOTE — ED Triage Notes (Signed)
Pt has been exposed to chlamydia by someone that tested positive. He denies penile discharge, or dysuria but does state that he has notice red spots on the end of his penis.

## 2018-12-04 LAB — HSV DNA BY PCR (REFERENCE LAB)
HSV 1 DNA: NEGATIVE
HSV 2 DNA: NEGATIVE

## 2018-12-04 LAB — HERPES SIMPLEX VIRUS(HSV) DNA BY PCR

## 2018-12-08 LAB — GC/CHLAMYDIA PROBE AMP
Chlamydia trachomatis, NAA: NEGATIVE
Neisseria Gonorrhoeae by PCR: NEGATIVE

## 2019-08-28 ENCOUNTER — Emergency Department: Payer: Self-pay

## 2019-08-28 ENCOUNTER — Encounter: Payer: Self-pay | Admitting: Emergency Medicine

## 2019-08-28 ENCOUNTER — Other Ambulatory Visit: Payer: Self-pay

## 2019-08-28 ENCOUNTER — Observation Stay
Admission: EM | Admit: 2019-08-28 | Discharge: 2019-08-30 | Disposition: A | Discharge status: Home / Self Care | Payer: Self-pay | Attending: Internal Medicine | Admitting: Internal Medicine

## 2019-08-28 ENCOUNTER — Emergency Department (HOSPITAL_BASED_OUTPATIENT_CLINIC_OR_DEPARTMENT_OTHER)
Admit: 2019-08-28 | Discharge: 2019-08-28 | Disposition: A | Discharge status: Home / Self Care | Payer: Self-pay | Attending: Cardiovascular Disease | Admitting: Cardiovascular Disease

## 2019-08-28 DIAGNOSIS — D72829 Elevated white blood cell count, unspecified: Secondary | ICD-10-CM

## 2019-08-28 DIAGNOSIS — Z7982 Long term (current) use of aspirin: Secondary | ICD-10-CM | POA: Insufficient documentation

## 2019-08-28 DIAGNOSIS — R778 Other specified abnormalities of plasma proteins: Secondary | ICD-10-CM | POA: Diagnosis present

## 2019-08-28 DIAGNOSIS — Z79899 Other long term (current) drug therapy: Secondary | ICD-10-CM | POA: Insufficient documentation

## 2019-08-28 DIAGNOSIS — J029 Acute pharyngitis, unspecified: Secondary | ICD-10-CM

## 2019-08-28 DIAGNOSIS — I409 Acute myocarditis, unspecified: Secondary | ICD-10-CM

## 2019-08-28 DIAGNOSIS — I401 Isolated myocarditis: Secondary | ICD-10-CM

## 2019-08-28 DIAGNOSIS — R079 Chest pain, unspecified: Secondary | ICD-10-CM

## 2019-08-28 DIAGNOSIS — I313 Pericardial effusion (noninflammatory): Secondary | ICD-10-CM | POA: Insufficient documentation

## 2019-08-28 DIAGNOSIS — I214 Non-ST elevation (NSTEMI) myocardial infarction: Principal | ICD-10-CM

## 2019-08-28 DIAGNOSIS — Z8249 Family history of ischemic heart disease and other diseases of the circulatory system: Secondary | ICD-10-CM | POA: Insufficient documentation

## 2019-08-28 DIAGNOSIS — R7989 Other specified abnormal findings of blood chemistry: Secondary | ICD-10-CM | POA: Diagnosis present

## 2019-08-28 DIAGNOSIS — Z20822 Contact with and (suspected) exposure to covid-19: Secondary | ICD-10-CM | POA: Insufficient documentation

## 2019-08-28 LAB — URINE DRUG SCREEN, QUALITATIVE (ARMC ONLY)
Amphetamines, Ur Screen: NOT DETECTED
Barbiturates, Ur Screen: NOT DETECTED
Benzodiazepine, Ur Scrn: NOT DETECTED
Cannabinoid 50 Ng, Ur ~~LOC~~: NOT DETECTED
Cocaine Metabolite,Ur ~~LOC~~: NOT DETECTED
MDMA (Ecstasy)Ur Screen: NOT DETECTED
Methadone Scn, Ur: NOT DETECTED
Opiate, Ur Screen: NOT DETECTED
Phencyclidine (PCP) Ur S: NOT DETECTED
Tricyclic, Ur Screen: NOT DETECTED

## 2019-08-28 LAB — BASIC METABOLIC PANEL
Anion gap: 8 (ref 5–15)
BUN: 14 mg/dL (ref 6–20)
CO2: 25 mmol/L (ref 22–32)
Calcium: 8.7 mg/dL — ABNORMAL LOW (ref 8.9–10.3)
Chloride: 103 mmol/L (ref 98–111)
Creatinine, Ser: 1.07 mg/dL (ref 0.61–1.24)
GFR calc Af Amer: >60 mL/min (ref >60)
GFR calc non Af Amer: >60 mL/min (ref >60)
Glucose, Bld: 121 mg/dL — ABNORMAL HIGH (ref 70–99)
Potassium: 3.5 mmol/L (ref 3.5–5.1)
Sodium: 136 mmol/L (ref 135–145)

## 2019-08-28 LAB — TROPONIN I (HIGH SENSITIVITY)
Troponin I (High Sensitivity): 6451 ng/L (ref <18)
Troponin I (High Sensitivity): 6397 ng/L (ref <18)
Troponin I (High Sensitivity): 3802 ng/L (ref <18)
Troponin I (High Sensitivity): 3613 ng/L (ref <18)

## 2019-08-28 LAB — ECHOCARDIOGRAM COMPLETE
Height: 66 in
Weight: 2560 oz

## 2019-08-28 LAB — CBC
HCT: 41.2 % (ref 39.0–52.0)
Hemoglobin: 13.2 g/dL (ref 13.0–17.0)
MCH: 28.4 pg (ref 26.0–34.0)
MCHC: 32 g/dL (ref 30.0–36.0)
MCV: 88.6 fL (ref 80.0–100.0)
Platelets: 567 10*3/uL — ABNORMAL HIGH (ref 150–400)
RBC: 4.65 MIL/uL (ref 4.22–5.81)
RDW: 12 % (ref 11.5–15.5)
WBC: 12.7 10*3/uL — ABNORMAL HIGH (ref 4.0–10.5)
nRBC: 0 % (ref 0.0–0.2)

## 2019-08-28 LAB — SEDIMENTATION RATE: Sed Rate: 8 mm/hr (ref 0–15)

## 2019-08-28 LAB — SARS CORONAVIRUS 2 BY RT PCR (HOSPITAL ORDER, PERFORMED IN ~~LOC~~ HOSPITAL LAB): SARS Coronavirus 2: NEGATIVE

## 2019-08-28 MED ORDER — NITROGLYCERIN 0.4 MG SL SUBL
0.4000 mg | SUBLINGUAL_TABLET | SUBLINGUAL | Status: DC | PRN
  Filled 2019-08-28: qty 1

## 2019-08-28 MED ORDER — HEPARIN SODIUM (PORCINE) 5000 UNIT/ML IJ SOLN
5000.0000 [IU] | Freq: Three times a day (TID) | INTRAMUSCULAR | Status: DC
  Administered 2019-08-28 – 2019-08-30 (×6): 5000 [IU] via SUBCUTANEOUS
  Filled 2019-08-28 (×6): qty 1

## 2019-08-28 MED ORDER — OXYCODONE-ACETAMINOPHEN 5-325 MG PO TABS: 1.0000 | ORAL_TABLET | ORAL | Status: DC | PRN

## 2019-08-28 MED ORDER — IOHEXOL 350 MG/ML SOLN
75.0000 mL | Freq: Once | INTRAVENOUS | Status: AC | PRN
  Administered 2019-08-28: 75 mL via INTRAVENOUS

## 2019-08-28 MED ORDER — IBUPROFEN 400 MG PO TABS
600.0000 mg | ORAL_TABLET | Freq: Three times a day (TID) | ORAL | Status: DC
  Administered 2019-08-28 – 2019-08-29 (×2): 600 mg via ORAL
  Filled 2019-08-28 (×2): qty 2

## 2019-08-28 MED ORDER — ACETAMINOPHEN 325 MG PO TABS: 650.0000 mg | ORAL_TABLET | Freq: Four times a day (QID) | ORAL | Status: DC | PRN

## 2019-08-28 MED ORDER — MORPHINE SULFATE (PF) 2 MG/ML IV SOLN: 2.0000 mg | INTRAVENOUS | Status: DC | PRN

## 2019-08-28 MED ORDER — ASPIRIN 81 MG PO CHEW
324.0000 mg | CHEWABLE_TABLET | Freq: Once | ORAL | Status: AC
  Administered 2019-08-28: 11:00:00 324 mg via ORAL
  Filled 2019-08-28: qty 4

## 2019-08-28 MED ORDER — ASPIRIN 81 MG PO CHEW
324.0000 mg | CHEWABLE_TABLET | Freq: Every day | ORAL | Status: DC
  Administered 2019-08-29: 10:00:00 324 mg via ORAL
  Filled 2019-08-28: qty 4

## 2019-08-28 MED ORDER — COLCHICINE 0.6 MG PO TABS
0.6000 mg | ORAL_TABLET | Freq: Two times a day (BID) | ORAL | Status: DC
  Administered 2019-08-28 – 2019-08-30 (×4): 0.6 mg via ORAL
  Filled 2019-08-28 (×7): qty 1

## 2019-08-28 MED ORDER — ONDANSETRON HCL 4 MG/2ML IJ SOLN: 4.0000 mg | Freq: Three times a day (TID) | INTRAMUSCULAR | Status: DC | PRN

## 2019-08-28 NOTE — Plan of Care (Signed)

## 2019-08-28 NOTE — ED Provider Notes (Signed)
[facility] Emergency Department Provider Note  ____________________________________________   First MD Initiated Contact with Patient 08/28/19 1023     (approximate)  I have reviewed the triage vital signs and the nursing notes.   HISTORY  Chief Complaint Chest Pain and Shortness of Breath    HPI Bradley Dickerson is a 29 y.o. male who is otherwise healthy who comes in for chest pain.  Patient reports 1 week of mild sharp chest pain that feels like a pressure sensation when he tries to take a deep breath, constant, not getting any better but not getting any worse over the past 1 week, denies this ever happening previously.  States he he had some chills a week ago but no current fevers.  Has not been tested for coronavirus.  No abdominal pain.          History reviewed. No pertinent past medical history.  There are no problems to display for this patient.   Past Surgical History:  Procedure Laterality Date  . NO PAST SURGERIES      Prior to Admission medications   Not on File    Allergies Patient has no known allergies.  Family History  Problem Relation Age of Onset  . Hyperlipidemia Mother     Social History Social History   Tobacco Use  . Smoking status: Never Smoker  . Smokeless tobacco: Never Used  Substance Use Topics  . Alcohol use: Yes    Alcohol/week: 6.0 standard drinks    Types: 6 Cans of beer per week  . Drug use: No      Review of Systems Constitutional: No fever/chills Eyes: No visual changes. ENT: No sore throat. Cardiovascular: Positive chest pain Respiratory: Positive shortness of breath Gastrointestinal: No abdominal pain.  No nausea, no vomiting.  No diarrhea.  No constipation. Genitourinary: Negative for dysuria. Musculoskeletal: Negative for back pain. Skin: Negative for rash. Neurological: Negative for headaches, focal weakness or numbness. All other ROS  negative ____________________________________________   PHYSICAL EXAM:  VITAL SIGNS: ED Triage Vitals  Enc Vitals Group     BP 08/28/19 0802 124/71     Pulse Rate 08/28/19 0759 98     Resp 08/28/19 0759 20     Temp 08/28/19 0802 98.5 F (36.9 C)     Temp Source 08/28/19 0802 Oral     SpO2 08/28/19 0802 98 %     Weight 08/28/19 0759 160 lb (72.6 kg)     Height 08/28/19 0759 5\' 6"  (1.676 m)     Head Circumference --      Peak Flow --      Pain Score --      Pain Loc --      Pain Edu? --      Excl. in [place]? --     Constitutional: Alert and oriented. Well appearing and in no acute distress. Eyes: Conjunctivae are normal. EOMI. Head: Atraumatic. Nose: No congestion/rhinnorhea. Mouth/Throat: Mucous membranes are moist.   Neck: No stridor. Trachea Midline. FROM Cardiovascular: Normal rate, regular rhythm. Grossly normal heart sounds.  Good peripheral circulation. Respiratory: Normal respiratory effort.  No retractions. Lungs CTAB. Gastrointestinal: Soft and nontender. No distention. No abdominal bruits.  Musculoskeletal: No lower extremity tenderness nor edema.  No joint effusions. Neurologic:  Normal speech and language. No gross focal neurologic deficits are appreciated.  Psychiatric: Mood and affect are normal. Speech and behavior are normal. GU: Deferred   ____________________________________________   LABS (all labs ordered are listed, but  only abnormal results are displayed)  Labs Reviewed  BASIC METABOLIC PANEL - Abnormal; Notable for the following components:      Result Value   Glucose, Bld 121 (*)    Calcium 8.7 (*)    All other components within normal limits  CBC - Abnormal; Notable for the following components:   WBC 12.7 (*)    Platelets 567 (*)    All other components within normal limits  TROPONIN I (HIGH SENSITIVITY) - Abnormal; Notable for the following components:   Troponin I (High Sensitivity) 6,451 (*)    All other components within normal limits   TROPONIN I (HIGH SENSITIVITY) - Abnormal; Notable for the following components:   Troponin I (High Sensitivity) 6,397 (*)    All other components within normal limits  SARS CORONAVIRUS 2 BY RT PCR (HOSPITAL ORDER, PERFORMED IN [facility])   ____________________________________________   ED ECG REPORT I, Concha Se, the attending physician, personally viewed and interpreted this ECG.  EKG is normal sinus rate of 89, no ST elevation, no T wave inversions, normal intervals ____________________________________________  RADIOLOGY Vela Prose, personally viewed and evaluated these images (plain radiographs) as part of my medical decision making, as well as reviewing the written report by the radiologist.  ED MD interpretation: No pneumonia  Official radiology report(s): DG Chest 2 View  Result Date: 08/28/2019 CLINICAL DATA:  Chest pain EXAM: CHEST - 2 VIEW COMPARISON:  None. FINDINGS: The heart size and mediastinal contours are within normal limits. Both lungs are clear. The visualized skeletal structures are unremarkable. IMPRESSION: Normal study. Electronically Signed   By: Charlett Nose M.D.   On: 08/28/2019 08:16   CT Angio Chest PE W and/or Wo Contrast  Result Date: 08/28/2019 CLINICAL DATA:  29 year old male with history of shortness of breath for the past 2 weeks. EXAM: CT ANGIOGRAPHY CHEST WITH CONTRAST TECHNIQUE: Multidetector CT imaging of the chest was performed using the standard protocol during bolus administration of intravenous contrast. Multiplanar CT image reconstructions and MIPs were obtained to evaluate the vascular anatomy. CONTRAST:  88mL OMNIPAQUE IOHEXOL 350 MG/ML SOLN COMPARISON:  No priors. FINDINGS: Cardiovascular: No filling defects within the pulmonary arterial tree to suggest underlying pulmonary embolism. Heart size is normal. There is no significant pericardial fluid, thickening or pericardial calcification. No atherosclerotic calcifications  in the thoracic aorta or the coronary arteries. Mediastinum/Nodes: No pathologically enlarged mediastinal or hilar lymph nodes. Esophagus is unremarkable in appearance. No axillary lymphadenopathy. Lungs/Pleura: No acute consolidative airspace disease. No pleural effusions. No suspicious appearing pulmonary nodules or masses are noted. Upper Abdomen: Unremarkable. Musculoskeletal: There are no aggressive appearing lytic or blastic lesions noted in the visualized portions of the skeleton. Review of the MIP images confirms the above findings. IMPRESSION: 1. No evidence of pulmonary embolism. 2. No acute findings in the thorax to account for the patient's symptoms. Electronically Signed   By: Trudie Reed M.D.   On: 08/28/2019 10:04    ____________________________________________   PROCEDURES  Procedure(s) performed (including Critical Care):  Procedures   ____________________________________________   INITIAL IMPRESSION / ASSESSMENT AND PLAN / ED COURSE   Bradley Dickerson was evaluated in Emergency Department on 08/28/2019 for the symptoms described in the history of present illness. He was evaluated in the context of the global COVID-19 pandemic, which necessitated consideration that the patient might be at risk for infection with the SARS-CoV-2 virus that causes COVID-19. Institutional protocols and algorithms that pertain to the evaluation of  patients at risk for COVID-19 are in a state of rapid change based on information released by regulatory bodies including the CDC and federal and state organizations. These policies and algorithms were followed during the patient's care in the ED.    Most Likely DDx:  -MSK (atypical chest pain) but will get cardiac markers to evaluate for ACS given risk factors/age   DDx that was also considered d/t potential to cause harm, but was found less likely based on history and physical (as detailed above): -PNA (no fevers, cough but CXR to evaluate) -PNX  (reassured with equal b/l breath sounds, CXR to evaluate) -Symptomatic anemia (will get H&H) -Pulmonary embolism as no sob at rest, not pleuritic in nature, no hypoxia -Aortic Dissection as no tearing pain and no radiation to the mid back, pulses equal -Pericarditis no rub on exam, EKG changes or hx to suggest dx -Tamponade (no notable SOB, tachycardic, hypotensive) -Esophageal rupture (no h/o diffuse vomitting/no crepitus)   Troponin significantly elevated greater than 6000 and.  Given the shortness of breath the patient's age will get CT scan to make sure no evidence of pulmonary embolism because of the troponin elevation.  CT PE is negative.  Repeat troponin slightly downtrending but still significantly elevated.  Patient has has no tachycardia and pain is currently very minimal in nature.  Discussed the case with Dr. Kirke Corin from cardiology who recommends holding off on heparin at this time in case this is myocarditis.  He was to get a stat echo and evaluate the patient first.    I discussed the hospital team for admission       ____________________________________________   FINAL CLINICAL IMPRESSION(S) / ED DIAGNOSES   Final diagnoses:  NSTEMI (non-ST elevated myocardial infarction) (HCC)     MEDICATIONS GIVEN DURING THIS VISIT:  Medications  morphine 2 MG/ML injection 2 mg (has no administration in time range)  nitroGLYCERIN (NITROSTAT) SL tablet 0.4 mg (has no administration in time range)  ondansetron (ZOFRAN) injection 4 mg (has no administration in time range)  acetaminophen (TYLENOL) tablet 650 mg (has no administration in time range)  oxyCODONE-acetaminophen (PERCOCET/ROXICET) 5-325 MG per tablet 1 tablet (has no administration in time range)  iohexol (OMNIPAQUE) 350 MG/ML injection 75 mL (75 mLs Intravenous Contrast Given 08/28/19 0949)  aspirin chewable tablet 324 mg (324 mg Oral Given 08/28/19 1053)     ED Discharge Orders    None       Note:  This document  was prepared using Dragon voice recognition software and may include unintentional dictation errors.   Concha Se, MD 08/28/19 1147

## 2019-08-28 NOTE — Progress Notes (Signed)
*  PRELIMINARY RESULTS* Echocardiogram 2D Echocardiogram has been performed.  Bradley Dickerson 08/28/2019, 12:46 PM

## 2019-08-28 NOTE — ED Triage Notes (Signed)
Pt reports CP for the last week. Pt reports pain is constant and he will intermittently have sharpness when he is sitting still. Pt reports when he takes a deep breath he feels like his chest is not expanding all the way.

## 2019-08-28 NOTE — H&P (Signed)
History and Physical    Bradley Dickerson EVO:[id number] DOB: April 22, 1990 DOA: 08/28/2019  Referring MD/NP/PA:   PCP: Patient, No Pcp Per   Patient coming from:  The patient is coming from home.  At baseline, pt is independent for most of ADL.        Chief Complaint: chest pain   HPI: Bradley ARATA is a 29 y.o. male without significant past medical history, who presents with chest pain.  Patient states that he has been having intermittent chest pain for about 1 week.  The chest pain is located in the front chest, sharp, sometimes pressure-like, 7 out of 10 in severity, nonradiating.  It is pleuritic, aggravated by deep breath. Patient states that he had mild subjective fever and chills 1 week ago.  Patient has had sick contact with his son and daughter who had strep throat recently.  He also had mild sore throat a week ago, which has resolved.  Currently patient does not have sore throat, fever or chills.  He has mild dry cough, no shortness of breath.  Denies nausea, vomiting, diarrhea, abdominal pain, symptoms of UTI or unilateral weakness. He states that he had 4 wisdom teeth pulled out 1 month ago.  ED Course: pt was found to have WBC 12.7, troponin 6451 --> 6397, negative COVID-19 PCR, electrolytes renal function okay, temperature normal, blood pressure 124/71, heart rate 86, RR 20, oxygen saturation 98% on room air.  Chest x-ray negative.  CT angiogram of chest is negative for PE.  Patient is placed on progressive bed for observation.  Cardiology, Dr. Fletcher Anon is consulted.   Review of Systems:   General: no fevers, chills, no body weight gain, has fatigue HEENT: no blurry vision, hearing changes or sore throat Respiratory: no dyspnea, has mild coughing, no wheezing CV: has chest pain, no palpitations GI: no nausea, vomiting, abdominal pain, diarrhea, constipation GU: no dysuria, burning on urination, increased urinary frequency, hematuria  Ext: no leg edema Neuro: no unilateral  weakness, numbness, or tingling, no vision change or hearing loss Skin: no rash, no skin tear. MSK: No muscle spasm, no deformity, no limitation of range of movement in spin Heme: No easy bruising.  Travel history: No recent long distant travel.  Allergy: No Known Allergies  History reviewed. No pertinent past medical history.  Past Surgical History:  Procedure Laterality Date  . dental procedure     4 wisdom teeth pulled out    Social History:  reports that he has never smoked. He has never used smokeless tobacco. He reports current alcohol use of about 6.0 standard drinks of alcohol per week. He reports that he does not use drugs.  Family History:  Family History  Problem Relation Age of Onset  . Hyperlipidemia Mother   . CAD Mother      Prior to Admission medications   Not on File    Physical Exam: Vitals:   08/28/19 1145 08/28/19 1200 08/28/19 1215 08/28/19 1230  BP:  117/75  104/64  Pulse: 85 88 81 80  Resp: (!) 21 12 18  (!) 21  Temp:      TempSrc:      SpO2: 97% 98% 96% 98%  Weight:      Height:       General: Not in acute distress HEENT:       Eyes: PERRL, EOMI, no scleral icterus.       ENT: No discharge from the ears and nose, no pharynx injection, no tonsillar enlargement.  Neck: No JVD, no bruit, no mass felt. Heme: No neck lymph node enlargement. Cardiac: S1/S2, RRR, No murmurs, No gallops or rubs. Respiratory: No rales, wheezing, rhonchi or rubs. GI: Soft, nondistended, nontender, no rebound pain, no organomegaly, BS present. GU: No hematuria Ext: No pitting leg edema bilaterally. 2+DP/PT pulse bilaterally. Musculoskeletal: No joint deformities, No joint redness or warmth, no limitation of ROM in spin. Skin: No rashes.  Neuro: Alert, oriented X3, cranial nerves II-XII grossly intact, moves all extremities normally. Psych: Patient is not psychotic, no suicidal or hemocidal ideation.  Labs on Admission: I have personally reviewed following  labs and imaging studies  CBC: Recent Labs  Lab 08/28/19 0802  WBC 12.7*  HGB 13.2  HCT 41.2  MCV 88.6  PLT 567*   Basic Metabolic Panel: Recent Labs  Lab 08/28/19 0802  NA 136  K 3.5  CL 103  CO2 25  GLUCOSE 121*  BUN 14  CREATININE 1.07  CALCIUM 8.7*   GFR: Estimated Creatinine Clearance: 91.9 mL/min (by C-G formula based on SCr of 1.07 mg/dL). Liver Function Tests: No results for input(s): AST, ALT, ALKPHOS, BILITOT, PROT, ALBUMIN in the last 168 hours. No results for input(s): LIPASE, AMYLASE in the last 168 hours. No results for input(s): AMMONIA in the last 168 hours. Coagulation Profile: No results for input(s): INR, PROTIME in the last 168 hours. Cardiac Enzymes: No results for input(s): CKTOTAL, CKMB, CKMBINDEX, TROPONINI in the last 168 hours. BNP (last 3 results) No results for input(s): PROBNP in the last 8760 hours. HbA1C: No results for input(s): HGBA1C in the last 72 hours. CBG: No results for input(s): GLUCAP in the last 168 hours. Lipid Profile: No results for input(s): CHOL, HDL, LDLCALC, TRIG, CHOLHDL, LDLDIRECT in the last 72 hours. Thyroid Function Tests: No results for input(s): TSH, T4TOTAL, FREET4, T3FREE, THYROIDAB in the last 72 hours. Anemia Panel: No results for input(s): VITAMINB12, FOLATE, FERRITIN, TIBC, IRON, RETICCTPCT in the last 72 hours. Urine analysis:    Component Value Date/Time   COLORURINE YELLOW (A) 06/25/2016 1050   APPEARANCEUR CLEAR (A) 06/25/2016 1050   LABSPEC 1.013 06/25/2016 1050   PHURINE 5.0 06/25/2016 1050   GLUCOSEU NEGATIVE 06/25/2016 1050   HGBUR NEGATIVE 06/25/2016 1050   BILIRUBINUR NEGATIVE 06/25/2016 1050   KETONESUR NEGATIVE 06/25/2016 1050   PROTEINUR NEGATIVE 06/25/2016 1050   NITRITE NEGATIVE 06/25/2016 1050   LEUKOCYTESUR NEGATIVE 06/25/2016 1050   Sepsis Labs: @LABRCNTIP (procalcitonin:4,lacticidven:4) ) Recent Results (from the past 240 hour(s))  SARS Coronavirus 2 by RT PCR (hospital  order, performed in [facility] hospital lab) Nasopharyngeal Nasopharyngeal Swab     Status: None   Collection Time: 08/28/19 10:54 AM   Specimen: Nasopharyngeal Swab  Result Value Ref Range Status   SARS Coronavirus 2 NEGATIVE NEGATIVE Final    Comment: (NOTE) SARS-CoV-2 target nucleic acids are NOT DETECTED. The SARS-CoV-2 RNA is generally detectable in upper and lower respiratory specimens during the acute phase of infection. The lowest concentration of SARS-CoV-2 viral copies this assay can detect is 250 copies / mL. A negative result does not preclude SARS-CoV-2 infection and should not be used as the sole basis for treatment or other patient management decisions.  A negative result may occur with improper specimen collection / handling, submission of specimen other than nasopharyngeal swab, presence of viral mutation(s) within the areas targeted by this assay, and inadequate number of viral copies (<250 copies / mL). A negative result must be combined with clinical observations, patient history, and  epidemiological information. Fact Sheet for Patients:   BoilerBrush.com.cy Fact Sheet for Healthcare Providers: https://pope.com/ This test is not yet approved or cleared  by the [place] FDA and has been authorized for detection and/or diagnosis of SARS-CoV-2 by FDA under an Emergency Use Authorization (EUA).  This EUA will remain in effect (meaning this test can be used) for the duration of the COVID-19 declaration under Section 564(b)(1) of the Act, 21 U.S.C. section 360bbb-3(b)(1), unless the authorization is terminated or revoked sooner. Performed at [facility], [address] [place], [place] [address]      Radiological Exams on Admission: DG Chest 2 View  Result Date: 08/28/2019 CLINICAL DATA:  Chest pain EXAM: CHEST - 2 VIEW COMPARISON:  None. FINDINGS: The heart size and mediastinal contours are within  normal limits. Both lungs are clear. The visualized skeletal structures are unremarkable. IMPRESSION: Normal study. Electronically Signed   By: Charlett Nose M.D.   On: 08/28/2019 08:16   CT Angio Chest PE W and/or Wo Contrast  Result Date: 08/28/2019 CLINICAL DATA:  29 year old male with history of shortness of breath for the past 2 weeks. EXAM: CT ANGIOGRAPHY CHEST WITH CONTRAST TECHNIQUE: Multidetector CT imaging of the chest was performed using the standard protocol during bolus administration of intravenous contrast. Multiplanar CT image reconstructions and MIPs were obtained to evaluate the vascular anatomy. CONTRAST:  40mL OMNIPAQUE IOHEXOL 350 MG/ML SOLN COMPARISON:  No priors. FINDINGS: Cardiovascular: No filling defects within the pulmonary arterial tree to suggest underlying pulmonary embolism. Heart size is normal. There is no significant pericardial fluid, thickening or pericardial calcification. No atherosclerotic calcifications in the thoracic aorta or the coronary arteries. Mediastinum/Nodes: No pathologically enlarged mediastinal or hilar lymph nodes. Esophagus is unremarkable in appearance. No axillary lymphadenopathy. Lungs/Pleura: No acute consolidative airspace disease. No pleural effusions. No suspicious appearing pulmonary nodules or masses are noted. Upper Abdomen: Unremarkable. Musculoskeletal: There are no aggressive appearing lytic or blastic lesions noted in the visualized portions of the skeleton. Review of the MIP images confirms the above findings. IMPRESSION: 1. No evidence of pulmonary embolism. 2. No acute findings in the thorax to account for the patient's symptoms. Electronically Signed   By: Trudie Reed M.D.   On: 08/28/2019 10:04   ECHOCARDIOGRAM COMPLETE  Result Date: 08/28/2019    ECHOCARDIOGRAM REPORT   Patient Name:   SADAO WEYER Date of Exam: 08/28/2019 Medical Rec #:  [id number]       Height:       66.0 in Accession #:    [id number]      Weight:       160.0  lb Date of Birth:  07-26-1990       BSA:          1.819 m Patient Age:    28 years        BP:           123/73 mmHg Patient Gender: M               HR:           83 bpm. Exam Location:  [facility] Procedure: 2D Echo, Color Doppler and Cardiac Doppler Indications:     Acute myocardial infarction 410  History:         Patient has no prior history of Echocardiogram examinations. No                  medical history on file.  Sonographer:     Dorene Sorrow  Hege RDCS (AE) Referring Phys:  [id number] MUHAMMAD A ARIDA Diagnosing Phys: Lorine BearsMuhammad Arida MD  Sonographer Comments: Suboptimal apical window. IMPRESSIONS  1. Left ventricular ejection fraction, by estimation, is 55 to 60%. The left ventricle has normal function. Left ventricular endocardial border not optimally defined to evaluate regional wall motion. Left ventricular diastolic parameters were normal.  2. Right ventricular systolic function is normal. The right ventricular size is normal. There is normal pulmonary artery systolic pressure.  3. The mitral valve is normal in structure. No evidence of mitral valve regurgitation. No evidence of mitral stenosis.  4. The aortic valve is normal in structure. Aortic valve regurgitation is not visualized. No aortic stenosis is present.  5. The inferior vena cava is normal in size with greater than 50% respiratory variability, suggesting right atrial pressure of 3 mmHg.  6. Trivial pericardial effusion is present. FINDINGS  Left Ventricle: Left ventricular ejection fraction, by estimation, is 55 to 60%. The left ventricle has normal function. Left ventricular endocardial border not optimally defined to evaluate regional wall motion. The left ventricular internal cavity size was normal in size. There is no left ventricular hypertrophy. Left ventricular diastolic parameters were normal. Right Ventricle: The right ventricular size is normal. No increase in right ventricular wall thickness. Right ventricular systolic function is normal. There is  normal pulmonary artery systolic pressure. The tricuspid regurgitant velocity is 2.43 m/s, and  with an assumed right atrial pressure of 10 mmHg, the estimated right ventricular systolic pressure is 33.6 mmHg. Left Atrium: Left atrial size was normal in size. Right Atrium: Right atrial size was normal in size. Pericardium: Trivial pericardial effusion is present. The pericardial effusion is circumferential. Mitral Valve: The mitral valve is normal in structure. Normal mobility of the mitral valve leaflets. No evidence of mitral valve regurgitation. No evidence of mitral valve stenosis. Tricuspid Valve: The tricuspid valve is normal in structure. Tricuspid valve regurgitation is trivial. No evidence of tricuspid stenosis. Aortic Valve: The aortic valve is normal in structure. Aortic valve regurgitation is not visualized. No aortic stenosis is present. Aortic valve mean gradient measures 2.5 mmHg. Aortic valve peak gradient measures 4.7 mmHg. Aortic valve area, by VTI measures 2.33 cm. Pulmonic Valve: The pulmonic valve was normal in structure. Pulmonic valve regurgitation is not visualized. No evidence of pulmonic stenosis. Aorta: The aortic root is normal in size and structure. Venous: The inferior vena cava is normal in size with greater than 50% respiratory variability, suggesting right atrial pressure of 3 mmHg. IAS/Shunts: No atrial level shunt detected by color flow Doppler.  LEFT VENTRICLE PLAX 2D LVIDd:         4.37 cm  Diastology LVIDs:         2.74 cm  LV e' lateral:   12.10 cm/s LV PW:         1.55 cm  LV E/e' lateral: 6.6 LV IVS:        0.73 cm  LV e' medial:    4.90 cm/s LVOT diam:     2.00 cm  LV E/e' medial:  16.3 LV SV:         42 LV SV Index:   23 LVOT Area:     3.14 cm  RIGHT VENTRICLE RV Basal diam:  3.91 cm RV S prime:     13.60 cm/s TAPSE (M-mode): 3.5 cm LEFT ATRIUM           Index       RIGHT ATRIUM  Index LA diam:      2.50 cm 1.37 cm/m  RA Area:     18.70 cm LA Vol (A2C): 24.7 ml  13.58 ml/m RA Volume:   58.50 ml  32.16 ml/m LA Vol (A4C): 18.2 ml 10.01 ml/m  AORTIC VALVE                   PULMONIC VALVE AV Area (Vmax):    2.41 cm    PV Vmax:        0.54 m/s AV Area (Vmean):   2.62 cm    PV Peak grad:   1.2 mmHg AV Area (VTI):     2.33 cm    RVOT Peak grad: 4 mmHg AV Vmax:           108.00 cm/s AV Vmean:          73.000 cm/s AV VTI:            0.179 m AV Peak Grad:      4.7 mmHg AV Mean Grad:      2.5 mmHg LVOT Vmax:         82.90 cm/s LVOT Vmean:        60.900 cm/s LVOT VTI:          0.133 m LVOT/AV VTI ratio: 0.74  AORTA Ao Root diam: 2.50 cm MITRAL VALVE               TRICUSPID VALVE MV Area (PHT): 3.72 cm    TR Peak grad:   23.6 mmHg MV Decel Time: 204 msec    TR Vmax:        243.00 cm/s MV E velocity: 79.70 cm/s MV A velocity: 43.30 cm/s  SHUNTS MV E/A ratio:  1.84        Systemic VTI:  0.13 m                            Systemic Diam: 2.00 cm Lorine Bears MD Electronically signed by Lorine Bears MD Signature Date/Time: 08/28/2019/1:10:38 PM    Final      EKG: Independently reviewed.  Sinus rhythm, QTC 435, LAE, poor R wave progression, anteroseptal infarction pattern, J-point elevation in precordial leads.  Assessment/Plan Principal Problem:   Chest pain Active Problems:   Leukocytosis   Elevated troponin   Chest pain and elevated trop: trop 5631 -->4970.  Differential diagnosis is myocarditis. Arida of card is consulted. Stat echo showed an EF of 55 to 60%, unable to evaluate for regional wall motion abnormalities, normal diastolic function with EF 55-60%, normal RV systolic function and ventricular cavity size, no significant valvular abnormality, trivial pericardial effusion noted.   - place to progressive unit for observation - Trend Trop - Repeat EKG in the am  - prn Nitroglycerin, Morphine, and aspirin - Risk factor stratification: will check FLP and A1C  - check UDS - f/u card further recommendations  Leukocytosis: WBC 12.7.  No source of infection  identified.  No fever.  Likely reactive. -f/u by CBC   DVT ppx: SQ Heparin    Code Status: Full code Family Communication:   Yes, patient's girlfriend at bed side Disposition Plan:  Anticipate discharge back to previous home environment Consults called:  Dr. Kirke Corin of Card Admission status: progressive unit for obs    Status is: Observation  The patient remains OBS appropriate and will d/c before 2 midnights.  Dispo: The patient is from: Home  Anticipated d/c is to: Home              Anticipated d/c date is: 1 day              Patient currently is not medically stable to d/c.      Date of Service 08/28/2019    Lorretta Harp [facility]   If 7PM-7AM, please contact night-coverage [email or web address] 08/28/2019, 1:44 PM

## 2019-08-28 NOTE — ED Notes (Signed)
Critical result: troponin 6,451. MD Fuller Plan aware

## 2019-08-28 NOTE — ED Notes (Signed)
Echo at bedside

## 2019-08-28 NOTE — Consult Note (Signed)
Cardiology Consultation:   Patient ID: Bradley Dickerson; [id number]; 07-24-1990   Admit date: 08/28/2019 Date of Consult: 08/28/2019  Primary Care Provider: Patient, No Pcp Per Primary Cardiologist: new to [facility] - consult by Arida   Patient Profile:   Bradley Dickerson is a 29 y.o. male with prior heavy alcohol use decreasing consumption ~ 12 months ago who is being seen today for the evaluation of chest pain/elevated HS-Tn at the request of Dr. Fuller Plan.  History of Present Illness:   Bradley Dickerson has no previously known cardiac history.  He previously drank approximately a 12 pack of beer on a daily basis for "a lot" of years, decreasing his consumption significantly approximately 12 months prior.  He denies any tobacco use or illicit substances.  He indicates his mother has history of CAD with MI at unknown age as well as hyperlipidemia.  He does not know his father's medical history.  He has not yet received the Covid vaccine and does not plan to receive this.  No prior known diagnosis of Covid.  He does report a recent febrile illness approximately 1 week prior and has children at home that are sick with a URI/strep.  He was in his usual state of health up until approximately 1 week ago when he developed constant chest pressure that was initially mild and worsened with deep inspiration and became sharp.  There was no change in his pain with positional movements such as leaning forward or sitting up.  Pain was not exacerbated with exertion.  No associated symptoms.  He has never had pain like this before.  Due to ongoing symptoms he presented to [facility] this morning.  Upon the patient's arrival to [facility] they were found to have stable vital signs. EKG showed sinus rhythm with right axis deviation and nonspecific ST-T changes, CXR showed no acute cardiopulmonary process.  CTA chest was negative for PE with no acute findings in the thorax.  No coronary artery calcification or aortic atherosclerosis  noted on images.  Labs showed an initial high-sensitivity troponin of 6451 with a delta of 6397, Covid negative, mild leukocytosis of 12.7 and thrombocytosis of 567.  He was given aspirin 324 mg x 1 in the ED.  Stat echo showed an EF of 55 to 60%, unable to evaluate for regional wall motion abnormalities, normal diastolic function, normal RV systolic function and ventricular cavity size, no significant valvular abnormality, trivial pericardial effusion noted.  He continues to note chest pressure at this time.  History reviewed. No pertinent past medical history.  Past Surgical History:  Procedure Laterality Date  . NO PAST SURGERIES       Home Meds: Prior to Admission medications   Not on File    Inpatient Medications: Scheduled Meds:  Continuous Infusions:  PRN Meds: acetaminophen, morphine injection, nitroGLYCERIN, ondansetron (ZOFRAN) IV, oxyCODONE-acetaminophen  Allergies:  No Known Allergies  Social History:   Social History   Socioeconomic History  . Marital status: Single    Spouse name: Not on file  . Number of children: Not on file  . Years of education: Not on file  . Highest education level: Not on file  Occupational History  . Not on file  Tobacco Use  . Smoking status: Never Smoker  . Smokeless tobacco: Never Used  Substance and Sexual Activity  . Alcohol use: Yes    Alcohol/week: 6.0 standard drinks    Types: 6 Cans of beer per week  . Drug use: No  .  Sexual activity: Not on file  Other Topics Concern  . Not on file  Social History Narrative  . Not on file   Social Determinants of Health   Financial Resource Strain:   . Difficulty of Paying Living Expenses:   Food Insecurity:   . Worried About Programme researcher, broadcasting/film/video in the Last Year:   . Barista in the Last Year:   Transportation Needs:   . Freight forwarder (Medical):   Marland Kitchen Lack of Transportation (Non-Medical):   Physical Activity:   . Days of Exercise per Week:   . Minutes of  Exercise per Session:   Stress:   . Feeling of Stress :   Social Connections:   . Frequency of Communication with Friends and Family:   . Frequency of Social Gatherings with Friends and Family:   . Attends Religious Services:   . Active Member of Clubs or Organizations:   . Attends Banker Meetings:   Marland Kitchen Marital Status:   Intimate Partner Violence:   . Fear of Current or Ex-Partner:   . Emotionally Abused:   Marland Kitchen Physically Abused:   . Sexually Abused:      Family History:   Family History  Problem Relation Age of Onset  . Hyperlipidemia Mother   . CAD Mother    His father's medical history is unknown.   ROS:  Review of Systems  Constitutional: Positive for fever and malaise/fatigue. Negative for chills, diaphoresis and weight loss.  HENT: Negative for congestion.   Eyes: Negative for discharge and redness.  Respiratory: Positive for shortness of breath. Negative for cough, sputum production and wheezing.   Cardiovascular: Positive for chest pain. Negative for palpitations, orthopnea, claudication, leg swelling and PND.  Gastrointestinal: Negative for abdominal pain, heartburn, nausea and vomiting.  Musculoskeletal: Negative for falls and myalgias.  Skin: Negative for rash.  Neurological: Negative for dizziness, tingling, tremors, sensory change, speech change, focal weakness, loss of consciousness and weakness.  Endo/Heme/Allergies: Does not bruise/bleed easily.  Psychiatric/Behavioral: Negative for substance abuse. The patient is not nervous/anxious.   All other systems reviewed and are negative.     Physical Exam/Data:   Vitals:   08/28/19 1145 08/28/19 1200 08/28/19 1215 08/28/19 1230  BP:  117/75  104/64  Pulse: 85 88 81 80  Resp: (!) 21 12 18  (!) 21  Temp:      TempSrc:      SpO2: 97% 98% 96% 98%  Weight:      Height:       No intake or output data in the 24 hours ending 08/28/19 1306 Filed Weights   08/28/19 0759  Weight: 72.6 kg   Body mass  index is 25.82 kg/m.   Physical Exam: General: Well developed, well nourished, in no acute distress. Head: Normocephalic, atraumatic, sclera non-icteric, no xanthomas, nares without discharge.  Neck: Negative for carotid bruits. JVD not elevated. Lungs: Clear bilaterally to auscultation without wheezes, rales, or rhonchi. Breathing is unlabored. Heart: RRR with S1 S2. No murmurs, rubs, or gallops appreciated. Abdomen: Soft, non-tender, non-distended with normoactive bowel sounds. No hepatomegaly. No rebound/guarding. No obvious abdominal masses. Msk:  Strength and tone appear normal for age. Extremities: No clubbing or cyanosis. No edema. Distal pedal pulses are 2+ and equal bilaterally. Neuro: Alert and oriented X 3. No facial asymmetry. No focal deficit. Moves all extremities spontaneously. Psych:  Responds to questions appropriately with a normal affect.   EKG:  The EKG was personally reviewed and demonstrates:  NSR, 89 bpm, right axis deviation, early repolarization abnormality, no acute st/t changes  Telemetry:  Telemetry was personally reviewed and demonstrates: SR  Weights: [employer]   08/28/19 0759  Weight: 72.6 kg    Relevant CV Studies:   2D echo 08/28/2019: 1. Left ventricular ejection fraction, by estimation, is 55 to 60%. The  left ventricle has normal function. Left ventricular endocardial border  not optimally defined to evaluate regional wall motion. Left ventricular  diastolic parameters were normal.  2. Right ventricular systolic function is normal. The right ventricular  size is normal. There is normal pulmonary artery systolic pressure.  3. The mitral valve is normal in structure. No evidence of mitral valve  regurgitation. No evidence of mitral stenosis.  4. The aortic valve is normal in structure. Aortic valve regurgitation is  not visualized. No aortic stenosis is present.  5. The inferior vena cava is normal in size with greater than 50%    respiratory variability, suggesting right atrial pressure of 3 mmHg.  6. Trivial pericardial effusion is present.   Laboratory Data:  Chemistry Recent Labs  Lab 08/28/19 0802  NA 136  K 3.5  CL 103  CO2 25  GLUCOSE 121*  BUN 14  CREATININE 1.07  CALCIUM 8.7*  GFRNONAA >60  GFRAA >60  ANIONGAP 8    No results for input(s): PROT, ALBUMIN, AST, ALT, ALKPHOS, BILITOT in the last 168 hours. Hematology Recent Labs  Lab 08/28/19 0802  WBC 12.7*  RBC 4.65  HGB 13.2  HCT 41.2  MCV 88.6  MCH 28.4  MCHC 32.0  RDW 12.0  PLT 567*   Cardiac EnzymesNo results for input(s): TROPONINI in the last 168 hours. No results for input(s): TROPIPOC in the last 168 hours.  BNPNo results for input(s): BNP, PROBNP in the last 168 hours.  DDimer No results for input(s): DDIMER in the last 168 hours.  Radiology/Studies:  DG Chest 2 View  Result Date: 08/28/2019 IMPRESSION: Normal study. Electronically Signed   By: Rolm Baptise M.D.   On: 08/28/2019 08:16   CT Angio Chest PE W and/or Wo Contrast  Result Date: 08/28/2019 IMPRESSION: 1. No evidence of pulmonary embolism. 2. No acute findings in the thorax to account for the patient's symptoms. Electronically Signed   By: Vinnie Langton M.D.   On: 08/28/2019 10:04    Assessment and Plan:   1.  Acute myocarditis: -Initial high-sensitivity troponin of 6451 with a delta of 6397 -Echo with preserved LV systolic function though images were suboptimal to evaluate for regional wall motion abnormalities.  A trivial pericardial effusion was noted on this study -He declines transfer to [facility] for evaluation by cardiac MRI -His history is consistent with myocarditis with his recent fever and documented close contact with illness -Monitor on telemetry  -Avoid alcohol consumption -Physical activity should be restricted -Consider cardiac MRI in outpatient follow-up -UDS pending -Check A1c and lipid panel for further risk ratification  2.   Leukocytosis/thrombocytosis: -No obvious infection at this time -Likely inflammatory in the setting of the above versus mild dehydration in the setting of mild thrombocytosis -Trend  3.  Hyperglycemia: -Check A1c   For questions or updates, please contact [place] Please consult [email or web address] for contact info under Cardiology/STEMI.   Signed, Christell Faith, PA-C [place] Pager: [phone] 08/28/2019, 1:06 PM

## 2019-08-29 DIAGNOSIS — I400 Infective myocarditis: Secondary | ICD-10-CM

## 2019-08-29 DIAGNOSIS — R0789 Other chest pain: Secondary | ICD-10-CM

## 2019-08-29 LAB — BASIC METABOLIC PANEL
Anion gap: 10 (ref 5–15)
BUN: 13 mg/dL (ref 6–20)
CO2: 26 mmol/L (ref 22–32)
Calcium: 8.8 mg/dL — ABNORMAL LOW (ref 8.9–10.3)
Chloride: 101 mmol/L (ref 98–111)
Creatinine, Ser: 0.97 mg/dL (ref 0.61–1.24)
GFR calc Af Amer: >60 mL/min (ref >60)
GFR calc non Af Amer: >60 mL/min (ref >60)
Glucose, Bld: 105 mg/dL — ABNORMAL HIGH (ref 70–99)
Potassium: 3.9 mmol/L (ref 3.5–5.1)
Sodium: 137 mmol/L (ref 135–145)

## 2019-08-29 LAB — CBC
HCT: 40.2 % (ref 39.0–52.0)
Hemoglobin: 13.2 g/dL (ref 13.0–17.0)
MCH: 28.6 pg (ref 26.0–34.0)
MCHC: 32.8 g/dL (ref 30.0–36.0)
MCV: 87 fL (ref 80.0–100.0)
Platelets: 543 10*3/uL — ABNORMAL HIGH (ref 150–400)
RBC: 4.62 MIL/uL (ref 4.22–5.81)
RDW: 12 % (ref 11.5–15.5)
WBC: 10.5 10*3/uL (ref 4.0–10.5)
nRBC: 0 % (ref 0.0–0.2)

## 2019-08-29 LAB — LIPID PANEL
Cholesterol: 149 mg/dL (ref 0–200)
HDL: 18 mg/dL — ABNORMAL LOW (ref >40)
LDL Cholesterol: 101 mg/dL — ABNORMAL HIGH (ref 0–99)
Total CHOL/HDL Ratio: 8.3 RATIO
Triglycerides: 149 mg/dL (ref <150)
VLDL: 30 mg/dL (ref 0–40)

## 2019-08-29 LAB — HEMOGLOBIN A1C
Hgb A1c MFr Bld: 5.5 % (ref 4.8–5.6)
Mean Plasma Glucose: 111.15 mg/dL

## 2019-08-29 LAB — TROPONIN I (HIGH SENSITIVITY)
Troponin I (High Sensitivity): 4737 ng/L (ref <18)
Troponin I (High Sensitivity): 6246 ng/L (ref <18)
Troponin I (High Sensitivity): 2945 ng/L (ref <18)

## 2019-08-29 LAB — HIV ANTIBODY (ROUTINE TESTING W REFLEX): HIV Screen 4th Generation wRfx: NONREACTIVE

## 2019-08-29 LAB — HIGH SENSITIVITY CRP: CRP, High Sensitivity: 131.25 mg/L — ABNORMAL HIGH (ref 0.00–3.00)

## 2019-08-29 MED ORDER — SODIUM CHLORIDE 0.9% FLUSH
10.0000 mL | Freq: Two times a day (BID) | INTRAVENOUS | Status: DC
  Administered 2019-08-29: 10 mL via INTRAVENOUS

## 2019-08-29 MED ORDER — ASPIRIN 81 MG PO CHEW: 650.0000 mg | CHEWABLE_TABLET | Freq: Two times a day (BID) | ORAL | Status: DC

## 2019-08-29 MED ORDER — METOPROLOL TARTRATE 25 MG PO TABS
12.5000 mg | ORAL_TABLET | Freq: Two times a day (BID) | ORAL | Status: DC
  Administered 2019-08-29 – 2019-08-30 (×3): 12.5 mg via ORAL
  Filled 2019-08-29 (×3): qty 1

## 2019-08-29 MED ORDER — PANTOPRAZOLE SODIUM 40 MG PO TBEC
40.0000 mg | DELAYED_RELEASE_TABLET | Freq: Every day | ORAL | Status: DC
  Administered 2019-08-29 – 2019-08-30 (×2): 40 mg via ORAL
  Filled 2019-08-29 (×2): qty 1

## 2019-08-29 MED ORDER — ASPIRIN 325 MG PO TABS
650.0000 mg | ORAL_TABLET | Freq: Three times a day (TID) | ORAL | Status: DC
  Administered 2019-08-29 – 2019-08-30 (×3): 650 mg via ORAL
  Filled 2019-08-29 (×5): qty 2

## 2019-08-29 NOTE — Progress Notes (Signed)
Progress Note  Patient Name: Bradley Dickerson Date of Encounter: 08/29/2019  Primary Cardiologist: new to [facility] - consult by Fletcher Anon  Subjective   He had an episode of chest pain around 12 AM this morning.  He did not notify staff.  He reports no further chest pain since.  High-sensitivity troponin was cycled around 1230 this morning and noted to be uptrending from a prior value of 3613 to a new value of 4737 and has continued to trend upwards at this morning with a current value of 6246.  He indicates he feels well at this time and is back to his baseline.  Inpatient Medications    Scheduled Meds: . aspirin  324 mg Oral Daily  . colchicine  0.6 mg Oral BID  . heparin  5,000 Units Subcutaneous Q8H  . ibuprofen  600 mg Oral TID   Continuous Infusions:  PRN Meds: acetaminophen, morphine injection, nitroGLYCERIN, ondansetron (ZOFRAN) IV, oxyCODONE-acetaminophen   Vital Signs    Vitals:   08/28/19 2130 08/28/19 2204 08/28/19 2347 08/29/19 0532  BP: 116/67 115/74 124/75 117/77  Pulse: 85 89 74 74  Resp: (!) 22 19 19 19   Temp:  99.3 F (37.4 C) 98.6 F (37 C) 97.8 F (36.6 C)  TempSrc:  Oral Oral Oral  SpO2: 94% 98% 98% 100%  Weight:  72.5 kg  70.9 kg  Height:  5\' 6"  (1.676 m)      Intake/Output Summary (Last 24 hours) at 08/29/2019 0729 Last data filed at 08/28/2019 2347 Gross per 24 hour  Intake 240 ml  Output 400 ml  Net -160 ml   Filed Weights   08/28/19 0759 08/28/19 2204 08/29/19 0532  Weight: 72.6 kg 72.5 kg 70.9 kg    Telemetry    SR- Personally Reviewed  ECG    NSR, 62 bpm, right axis deviation, J-point elevation along anterior leads, T wave inversion slightly more pronounced- Personally Reviewed  Physical Exam   GEN: No acute distress.   Neck: No JVD. Cardiac: RRR, no murmurs, rubs, or gallops.  Respiratory: Clear to auscultation bilaterally.  GI: Soft, nontender, non-distended.   MS: No edema; No deformity. Neuro:  Alert and oriented x 3;  Nonfocal.  Psych: Normal affect.  Labs    Chemistry Recent Labs  Lab 08/28/19 0802 08/29/19 0029  NA 136 137  K 3.5 3.9  CL 103 101  CO2 25 26  GLUCOSE 121* 105*  BUN 14 13  CREATININE 1.07 0.97  CALCIUM 8.7* 8.8*  GFRNONAA >60 >60  GFRAA >60 >60  ANIONGAP 8 10     Hematology Recent Labs  Lab 08/28/19 0802 08/29/19 0029  WBC 12.7* 10.5  RBC 4.65 4.62  HGB 13.2 13.2  HCT 41.2 40.2  MCV 88.6 87.0  MCH 28.4 28.6  MCHC 32.0 32.8  RDW 12.0 12.0  PLT 567* 543*    Cardiac EnzymesNo results for input(s): TROPONINI in the last 168 hours. No results for input(s): TROPIPOC in the last 168 hours.   BNPNo results for input(s): BNP, PROBNP in the last 168 hours.   DDimer No results for input(s): DDIMER in the last 168 hours.   Radiology    DG Chest 2 View  Result Date: 08/28/2019 IMPRESSION: Normal study. Electronically Signed   By: Rolm Baptise M.D.   On: 08/28/2019 08:16   CT Angio Chest PE W and/or Wo Contrast  Result Date: 08/28/2019 IMPRESSION: 1. No evidence of pulmonary embolism. 2. No acute findings in the thorax  to account for the patient's symptoms. Electronically Signed   By: Trudie Reed M.D.   On: 08/28/2019 10:04    Cardiac Studies   2D echo 08/28/2019: 1. Left ventricular ejection fraction, by estimation, is 55 to 60%. The  left ventricle has normal function. Left ventricular endocardial border  not optimally defined to evaluate regional wall motion. Left ventricular  diastolic parameters were normal.  2. Right ventricular systolic function is normal. The right ventricular  size is normal. There is normal pulmonary artery systolic pressure.  3. The mitral valve is normal in structure. No evidence of mitral valve  regurgitation. No evidence of mitral stenosis.  4. The aortic valve is normal in structure. Aortic valve regurgitation is  not visualized. No aortic stenosis is present.  5. The inferior vena cava is normal in size with greater  than 50%  respiratory variability, suggesting right atrial pressure of 3 mmHg.  6. Trivial pericardial effusion is present.  Patient Profile     29 y.o. male with history of prior heavy alcohol use decreasing consumption ~ 12 months ago who is being seen today for the evaluation of chest pain/elevated HS-Tn.  Assessment & Plan    1.  Presumed myocarditis: -Currently chest pain-free -High-sensitivity troponin is uptrending this morning, continue to cycle this afternoon until peak -Multiple discussions with the patient regarding treatment options including transfer to [facility] for cardiac MRI with potential coronary CTA, versus diagnostic cardiac cath -He declines transfer to [facility] as well as diagnostic cardiac cath at this time and would like to continue medical therapy without further testing -Await repeat troponin as above -Ambulate in the hallway -Stop ibuprofen -Start high-dose ASA 650 mg twice daily -Continue colchicine 0.6 mg twice daily -Add Protonix 40 mg daily -Recommend he remain out of work through at least the end of this week with potential reduced hours/workload next week as well -He understands the risks of his medical decision making and accepts them    For questions or updates, please contact [facility] Please consult [email or web address] for contact info under Cardiology/STEMI.    Signed, Eula Listen, PA-C [facility] Pager: [phone] 08/29/2019, 7:29 AM

## 2019-08-29 NOTE — Progress Notes (Signed)
PROGRESS NOTE    DAWN KIPER  QQV:[id number] DOB: 1990-12-25 DOA: 08/28/2019 PCP: Patient, No Pcp Per   Brief Narrative:  Patient states that he has been having intermittent chest pain for about 1 week.  The chest pain is located in the front chest, sharp, sometimes pressure-like, 7 out of 10 in severity, nonradiating.  It is pleuritic, aggravated by deep breath. Patient states that he had mild subjective fever and chills 1 week ago.  In the emergency room, troponin peaked at 6451, negative Covid PCR.  He was seen by cardiology,  started on NSAIDs for acute myocarditis.  Echocardiogram showed ejection fraction 55 to 60%.    Assessment & Plan:   Principal Problem:   Chest pain Active Problems:   Leukocytosis   Elevated troponin  #1.  Acute viral myocarditis. Troponin went up again to 6246 this morning.  As result, patient will be monitored again tonight.  Recheck troponin level again tomorrow.  Continue telemetry monitor.  Appreciate cardiology consult.  2.  Chest pain.  Secondary to myocarditis.    DVT prophylaxis: Heparin Code Status: Full Family Communication: None Disposition Plan:  . Patient came from: Home            . Anticipated d/c place: Home . Barriers to d/c OR conditions which need to be met to effect a safe d/c:   Consultants:   Cardiology  Procedures: None Antimicrobials: None  Subjective: Patient still has some pleuritic chest pain.  No shortness of breath.  No cough. No nausea vomiting diarrhea.  Objective: Vitals:   08/28/19 2347 08/29/19 0532 08/29/19 0748 08/29/19 1140  BP: 124/75 117/77 113/71 113/63  Pulse: 74 74 77 69  Resp: 19 19 18 18   Temp: 98.6 F (37 C) 97.8 F (36.6 C) 98.2 F (36.8 C) 98.1 F (36.7 C)  TempSrc: Oral Oral Oral   SpO2: 98% 100% 100% 98%  Weight:  70.9 kg    Height:        Intake/Output Summary (Last 24 hours) at 08/29/2019 1334 Last data filed at 08/29/2019 1139 Gross per 24 hour  Intake 240 ml  Output  700 ml  Net -460 ml   Filed Weights   08/28/19 0759 08/28/19 2204 08/29/19 0532  Weight: 72.6 kg 72.5 kg 70.9 kg    Examination:  General exam: Appears calm and comfortable  Respiratory system: Clear to auscultation. Respiratory effort normal. Cardiovascular system: S1 & S2 heard, RRR. No JVD, murmurs, rubs, gallops or clicks. No pedal edema. Gastrointestinal system: Abdomen is nondistended, soft and nontender. No organomegaly or masses felt. Normal bowel sounds heard. Central nervous system: Alert and oriented. No focal neurological deficits. Extremities: Symmetric 5 x 5 power. Skin: No rashes, lesions or ulcers Psychiatry: Judgement and insight appear normal. Mood & affect appropriate.     Data Reviewed: I have personally reviewed following labs and imaging studies  CBC: Recent Labs  Lab 08/28/19 0802 08/29/19 0029  WBC 12.7* 10.5  HGB 13.2 13.2  HCT 41.2 40.2  MCV 88.6 87.0  PLT 567* 332*   Basic Metabolic Panel: Recent Labs  Lab 08/28/19 0802 08/29/19 0029  NA 136 137  K 3.5 3.9  CL 103 101  CO2 25 26  GLUCOSE 121* 105*  BUN 14 13  CREATININE 1.07 0.97  CALCIUM 8.7* 8.8*   GFR: Estimated Creatinine Clearance: 101.4 mL/min (by C-G formula based on SCr of 0.97 mg/dL). Liver Function Tests: No results for input(s): AST, ALT, ALKPHOS, BILITOT, PROT, ALBUMIN in  the last 168 hours. No results for input(s): LIPASE, AMYLASE in the last 168 hours. No results for input(s): AMMONIA in the last 168 hours. Coagulation Profile: No results for input(s): INR, PROTIME in the last 168 hours. Cardiac Enzymes: No results for input(s): CKTOTAL, CKMB, CKMBINDEX, TROPONINI in the last 168 hours. BNP (last 3 results) No results for input(s): PROBNP in the last 8760 hours. HbA1C: Recent Labs    08/29/19 0029  HGBA1C 5.5   CBG: No results for input(s): GLUCAP in the last 168 hours. Lipid Profile: Recent Labs    08/29/19 0029  CHOL 149  HDL 18*  LDLCALC 101*  TRIG  149  CHOLHDL 8.3   Thyroid Function Tests: No results for input(s): TSH, T4TOTAL, FREET4, T3FREE, THYROIDAB in the last 72 hours. Anemia Panel: No results for input(s): VITAMINB12, FOLATE, FERRITIN, TIBC, IRON, RETICCTPCT in the last 72 hours. Sepsis Labs: No results for input(s): PROCALCITON, LATICACIDVEN in the last 168 hours.  Recent Results (from the past 240 hour(s))  SARS Coronavirus 2 by RT PCR (hospital order, performed in [facility] hospital lab) Nasopharyngeal Nasopharyngeal Swab     Status: None   Collection Time: 08/28/19 10:54 AM   Specimen: Nasopharyngeal Swab  Result Value Ref Range Status   SARS Coronavirus 2 NEGATIVE NEGATIVE Final    Comment: (NOTE) SARS-CoV-2 target nucleic acids are NOT DETECTED. The SARS-CoV-2 RNA is generally detectable in upper and lower respiratory specimens during the acute phase of infection. The lowest concentration of SARS-CoV-2 viral copies this assay can detect is 250 copies / mL. A negative result does not preclude SARS-CoV-2 infection and should not be used as the sole basis for treatment or other patient management decisions.  A negative result may occur with improper specimen collection / handling, submission of specimen other than nasopharyngeal swab, presence of viral mutation(s) within the areas targeted by this assay, and inadequate number of viral copies (<250 copies / mL). A negative result must be combined with clinical observations, patient history, and epidemiological information. Fact Sheet for Patients:   StrictlyIdeas.no Fact Sheet for Healthcare Providers: BankingDealers.co.za This test is not yet approved or cleared  by the [place] FDA and has been authorized for detection and/or diagnosis of SARS-CoV-2 by FDA under an Emergency Use Authorization (EUA).  This EUA will remain in effect (meaning this test can be used) for the duration of the COVID-19 declaration  under Section 564(b)(1) of the Act, 21 U.S.C. section 360bbb-3(b)(1), unless the authorization is terminated or revoked sooner. Performed at [facility], [address] [place], [place] [address]          Radiology Studies: DG Chest 2 View  Result Date: 08/28/2019 CLINICAL DATA:  Chest pain EXAM: CHEST - 2 VIEW COMPARISON:  None. FINDINGS: The heart size and mediastinal contours are within normal limits. Both lungs are clear. The visualized skeletal structures are unremarkable. IMPRESSION: Normal study. Electronically Signed   By: Rolm Baptise M.D.   On: 08/28/2019 08:16   CT Angio Chest PE W and/or Wo Contrast  Result Date: 08/28/2019 CLINICAL DATA:  29 year old male with history of shortness of breath for the past 2 weeks. EXAM: CT ANGIOGRAPHY CHEST WITH CONTRAST TECHNIQUE: Multidetector CT imaging of the chest was performed using the standard protocol during bolus administration of intravenous contrast. Multiplanar CT image reconstructions and MIPs were obtained to evaluate the vascular anatomy. CONTRAST:  6m OMNIPAQUE IOHEXOL 350 MG/ML SOLN COMPARISON:  No priors. FINDINGS: Cardiovascular: No filling defects within the  pulmonary arterial tree to suggest underlying pulmonary embolism. Heart size is normal. There is no significant pericardial fluid, thickening or pericardial calcification. No atherosclerotic calcifications in the thoracic aorta or the coronary arteries. Mediastinum/Nodes: No pathologically enlarged mediastinal or hilar lymph nodes. Esophagus is unremarkable in appearance. No axillary lymphadenopathy. Lungs/Pleura: No acute consolidative airspace disease. No pleural effusions. No suspicious appearing pulmonary nodules or masses are noted. Upper Abdomen: Unremarkable. Musculoskeletal: There are no aggressive appearing lytic or blastic lesions noted in the visualized portions of the skeleton. Review of the MIP images confirms the above findings. IMPRESSION: 1. No  evidence of pulmonary embolism. 2. No acute findings in the thorax to account for the patient's symptoms. Electronically Signed   By: Vinnie Langton M.D.   On: 08/28/2019 10:04   ECHOCARDIOGRAM COMPLETE  Result Date: 08/28/2019    ECHOCARDIOGRAM REPORT   Patient Name:   BENSEN CHADDERDON Date of Exam: 08/28/2019 Medical Rec #:  [id number]       Height:       66.0 in Accession #:    [id number]      Weight:       160.0 lb Date of Birth:  [birth year]       BSA:          1.819 m Patient Age:    28 years        BP:           123/73 mmHg Patient Gender: M               HR:           83 bpm. Exam Location:  [facility] Procedure: 2D Echo, Color Doppler and Cardiac Doppler Indications:     Acute myocardial infarction 410  History:         Patient has no prior history of Echocardiogram examinations. No                  medical history on file.  Sonographer:     Sherrie Sport RDCS (AE) Referring Phys:  [facility] Diagnosing Phys: Kathlyn Sacramento MD  Sonographer Comments: Suboptimal apical window. IMPRESSIONS  1. Left ventricular ejection fraction, by estimation, is 55 to 60%. The left ventricle has normal function. Left ventricular endocardial border not optimally defined to evaluate regional wall motion. Left ventricular diastolic parameters were normal.  2. Right ventricular systolic function is normal. The right ventricular size is normal. There is normal pulmonary artery systolic pressure.  3. The mitral valve is normal in structure. No evidence of mitral valve regurgitation. No evidence of mitral stenosis.  4. The aortic valve is normal in structure. Aortic valve regurgitation is not visualized. No aortic stenosis is present.  5. The inferior vena cava is normal in size with greater than 50% respiratory variability, suggesting right atrial pressure of 3 mmHg.  6. Trivial pericardial effusion is present. FINDINGS  Left Ventricle: Left ventricular ejection fraction, by estimation, is 55 to 60%. The left ventricle has  normal function. Left ventricular endocardial border not optimally defined to evaluate regional wall motion. The left ventricular internal cavity size was normal in size. There is no left ventricular hypertrophy. Left ventricular diastolic parameters were normal. Right Ventricle: The right ventricular size is normal. No increase in right ventricular wall thickness. Right ventricular systolic function is normal. There is normal pulmonary artery systolic pressure. The tricuspid regurgitant velocity is 2.43 m/s, and  with an assumed right atrial pressure of 10 mmHg, the estimated right  ventricular systolic pressure is 76.8 mmHg. Left Atrium: Left atrial size was normal in size. Right Atrium: Right atrial size was normal in size. Pericardium: Trivial pericardial effusion is present. The pericardial effusion is circumferential. Mitral Valve: The mitral valve is normal in structure. Normal mobility of the mitral valve leaflets. No evidence of mitral valve regurgitation. No evidence of mitral valve stenosis. Tricuspid Valve: The tricuspid valve is normal in structure. Tricuspid valve regurgitation is trivial. No evidence of tricuspid stenosis. Aortic Valve: The aortic valve is normal in structure. Aortic valve regurgitation is not visualized. No aortic stenosis is present. Aortic valve mean gradient measures 2.5 mmHg. Aortic valve peak gradient measures 4.7 mmHg. Aortic valve area, by VTI measures 2.33 cm. Pulmonic Valve: The pulmonic valve was normal in structure. Pulmonic valve regurgitation is not visualized. No evidence of pulmonic stenosis. Aorta: The aortic root is normal in size and structure. Venous: The inferior vena cava is normal in size with greater than 50% respiratory variability, suggesting right atrial pressure of 3 mmHg. IAS/Shunts: No atrial level shunt detected by color flow Doppler.  LEFT VENTRICLE PLAX 2D LVIDd:         4.37 cm  Diastology LVIDs:         2.74 cm  LV e' lateral:   12.10 cm/s LV PW:          1.55 cm  LV E/e' lateral: 6.6 LV IVS:        0.73 cm  LV e' medial:    4.90 cm/s LVOT diam:     2.00 cm  LV E/e' medial:  16.3 LV SV:         42 LV SV Index:   23 LVOT Area:     3.14 cm  RIGHT VENTRICLE RV Basal diam:  3.91 cm RV S prime:     13.60 cm/s TAPSE (M-mode): 3.5 cm LEFT ATRIUM           Index       RIGHT ATRIUM           Index LA diam:      2.50 cm 1.37 cm/m  RA Area:     18.70 cm LA Vol (A2C): 24.7 ml 13.58 ml/m RA Volume:   58.50 ml  32.16 ml/m LA Vol (A4C): 18.2 ml 10.01 ml/m  AORTIC VALVE                   PULMONIC VALVE AV Area (Vmax):    2.41 cm    PV Vmax:        0.54 m/s AV Area (Vmean):   2.62 cm    PV Peak grad:   1.2 mmHg AV Area (VTI):     2.33 cm    RVOT Peak grad: 4 mmHg AV Vmax:           108.00 cm/s AV Vmean:          73.000 cm/s AV VTI:            0.179 m AV Peak Grad:      4.7 mmHg AV Mean Grad:      2.5 mmHg LVOT Vmax:         82.90 cm/s LVOT Vmean:        60.900 cm/s LVOT VTI:          0.133 m LVOT/AV VTI ratio: 0.74  AORTA Ao Root diam: 2.50 cm MITRAL VALVE  TRICUSPID VALVE MV Area (PHT): 3.72 cm    TR Peak grad:   23.6 mmHg MV Decel Time: 204 msec    TR Vmax:        243.00 cm/s MV E velocity: 79.70 cm/s MV A velocity: 43.30 cm/s  SHUNTS MV E/A ratio:  1.84        Systemic VTI:  0.13 m                            Systemic Diam: 2.00 cm Kathlyn Sacramento MD Electronically signed by Kathlyn Sacramento MD Signature Date/Time: 08/28/2019/1:10:38 PM    Final         Scheduled Meds: . aspirin  650 mg Oral Q8H  . colchicine  0.6 mg Oral BID  . heparin  5,000 Units Subcutaneous Q8H  . metoprolol tartrate  12.5 mg Oral BID  . pantoprazole  40 mg Oral Daily   Continuous Infusions:   LOS: 0 days    Time spent: 22 minutes    Sharen Hones, MD [facility]   To contact the attending provider between 7A-7P or the covering provider during after hours 7P-7A, please log into the web site [email or web address] and access using universal [place] password for  that web site. If you do not have the password, please call the hospital operator.  08/29/2019, 1:34 PM

## 2019-08-29 NOTE — Progress Notes (Signed)
Patient is currently declining to have MRI performed due to possible cost. Patient is concerned the procedure will put him in debt. Suggested patient she needs to speak with case management for possible financial options.

## 2019-08-29 NOTE — Plan of Care (Signed)
  Problem: Education: Goal: Knowledge of General Education information will improve Description: Including pain rating scale, medication(s)/side effects and non-pharmacologic comfort measures Outcome: Progressing   Problem: Health Behavior/Discharge Planning: Goal: Ability to manage health-related needs will improve Outcome: Not Progressing Note: Patient refusing transfer to [facility] for a cardiac MRI or any further off-site testing / diagnostics. Only medically managing here. Will continue to monitor overall progression for the remainder of the shift. Possible d/c later in shift. Jari Favre [facility]

## 2019-08-30 DIAGNOSIS — R079 Chest pain, unspecified: Secondary | ICD-10-CM

## 2019-08-30 DIAGNOSIS — J029 Acute pharyngitis, unspecified: Secondary | ICD-10-CM

## 2019-08-30 DIAGNOSIS — I214 Non-ST elevation (NSTEMI) myocardial infarction: Principal | ICD-10-CM

## 2019-08-30 DIAGNOSIS — I409 Acute myocarditis, unspecified: Secondary | ICD-10-CM

## 2019-08-30 DIAGNOSIS — R778 Other specified abnormalities of plasma proteins: Secondary | ICD-10-CM

## 2019-08-30 LAB — ANTISTREPTOLYSIN O TITER: ASO: 455 IU/mL — ABNORMAL HIGH (ref 0.0–200.0)

## 2019-08-30 LAB — TROPONIN I (HIGH SENSITIVITY): Troponin I (High Sensitivity): 2975 ng/L (ref <18)

## 2019-08-30 MED ORDER — METOPROLOL TARTRATE 25 MG PO TABS: 12.5000 mg | ORAL_TABLET | Freq: Two times a day (BID) | ORAL | 0 refills | Status: DC

## 2019-08-30 MED ORDER — ASPIRIN 325 MG PO TABS: 650.0000 mg | ORAL_TABLET | Freq: Three times a day (TID) | ORAL | 0 refills | Status: DC

## 2019-08-30 MED ORDER — COLCHICINE 0.6 MG PO TABS: 0.6000 mg | ORAL_TABLET | Freq: Two times a day (BID) | ORAL | 2 refills | Status: DC

## 2019-08-30 MED ORDER — AMOXICILLIN-POT CLAVULANATE 875-125 MG PO TABS: 1.0000 | ORAL_TABLET | Freq: Two times a day (BID) | ORAL | Status: DC

## 2019-08-30 MED ORDER — AMOXICILLIN-POT CLAVULANATE 875-125 MG PO TABS: 1.0000 | ORAL_TABLET | Freq: Two times a day (BID) | ORAL | 9 refills | Status: DC

## 2019-08-30 NOTE — TOC Progression Note (Signed)
Transition of Care [facility]) - Progression Note    Patient Details  Name: Bradley Dickerson MRN: [id number] Date of Birth: 15-Aug-1990  [facility] CM/SW Contact  Shawn Route, RN Phone Number: 08/30/2019, 10:11 AM  Clinical Narrative:      Consulted for Medication for Management and PCP.  Patient referred to Medication Management, Referral sent to [facility] and application given to patient. No further TOC needs at this time, please re-consult for new needs.         Expected Discharge Plan and Services           Expected Discharge Date: 08/30/19                                     Social Determinants of Health (SDOH) Interventions    Readmission Risk Interventions No flowsheet data found.

## 2019-08-30 NOTE — Discharge Summary (Signed)
[facility] [place] [facility]   PATIENT NAME: Bradley Dickerson    MR#:  [id number]  DATE OF BIRTH:  05-29-1990  DATE OF ADMISSION:  08/28/2019 ADMITTING PHYSICIAN: Lorretta Harp, MD  DATE OF DISCHARGE: 08/30/2019 11:50 AM  PRIMARY CARE PHYSICIAN: Patient, No Pcp Per    ADMISSION DIAGNOSIS:  NSTEMI (non-ST elevated myocardial infarction) [facility]) [I21.4] Chest pain [R07.9]  DISCHARGE DIAGNOSIS:  Hospital course  1.  Myocarditis with elevated troponin and chest pain.  Currently patient is chest pain-free and does not have any shortness of breath.  Troponin peaked at 6246 and came down to 2975.  Patient was started on aspirin high-dose 3 times a day and that will be prescribed for 1 week upon discharge.  I also prescribed colchicine twice a day for 3 months.  Explained to patient side effect can be diarrhea with this medication.  Follow-up with cardiology 1 week.  Cardiology started low-dose beta-blocker. 2.  Sore throat with elevated ASO titer.  Patient does have a sore throat and kids were recently diagnosed with strep throat.  I will prescribe Augmentin 875 mg twice a day for 10 days, since the patient has enlarged tonsils and whitish exudate on the left tonsil.  Can follow-up ASO titer as outpatient if need be.  Patient does not have any fever.  As per transitional care team we were able to do medications through medication management.   DISCHARGE CONDITIONS:   Satisfactory  CONSULTS OBTAINED:  Treatment Team:  Iran Ouch, MD  DRUG ALLERGIES:  No Known Allergies  DISCHARGE MEDICATIONS:   Allergies as of 08/30/2019   No Known Allergies     Medication List    TAKE these medications   amoxicillin-clavulanate 875-125 MG tablet Commonly known as: AUGMENTIN Take 1 tablet by mouth every 12 (twelve) hours.   aspirin 325 MG tablet Take 2 tablets (650 mg total) by mouth every 8 (eight) hours.   colchicine 0.6 MG tablet Take 1 tablet (0.6 mg total) by  mouth 2 (two) times daily.   metoprolol tartrate 25 MG tablet Commonly known as: LOPRESSOR Take 0.5 tablets (12.5 mg total) by mouth 2 (two) times daily.        DISCHARGE INSTRUCTIONS:   [facility] Follow-up cardiology 1 week  If you experience worsening of your admission symptoms, develop shortness of breath, life threatening emergency, suicidal or homicidal thoughts you must seek medical attention immediately by calling 911 or calling your MD immediately  if symptoms less severe.  You Must read complete instructions/literature along with all the possible adverse reactions/side effects for all the Medicines you take and that have been prescribed to you. Take any new Medicines after you have completely understood and accept all the possible adverse reactions/side effects.   Please note  You were cared for by a hospitalist during your hospital stay. If you have any questions about your discharge medications or the care you received while you were in the hospital after you are discharged, you can call the unit and asked to speak with the hospitalist on call if the hospitalist that took care of you is not available. Once you are discharged, your primary care physician will handle any further medical issues. Please note that NO REFILLS for any discharge medications will be authorized once you are discharged, as it is imperative that you return to your primary care physician (or establish a relationship with a primary care physician if you do not have one) for  your aftercare needs so that they can reassess your need for medications and monitor your lab values.    Today   CHIEF COMPLAINT:   Chief Complaint  Patient presents with  . Chest Pain  . Shortness of Breath    HISTORY OF PRESENT ILLNESS:  Bradley Dickerson  is a 29 y.o. male came in with chest pain and shortness of breath   VITAL SIGNS:  Blood pressure 114/62, pulse 65, temperature 98.3 F (36.8 C),  temperature source Oral, resp. rate 20, height 5\' 6"  (1.676 m), weight 70.9 kg, SpO2 98 %.   PHYSICAL EXAMINATION:  GENERAL:  29 y.o.-year-old patient lying in the bed with no acute distress.  EYES: Pupils equal, round, reactive to light and accommodation. No scleral icterus. Extraocular muscles intact.  HEENT: Head atraumatic, normocephalic.  Tonsils enlarged. Whitish exudate seen on left tonsil.  Back of the throat erythematous. NECK: No lymphadenopathy palpated LUNGS: Normal breath sounds bilaterally, no wheezing, rales,rhonchi or crepitation. No use of accessory muscles of respiration.  CARDIOVASCULAR: S1, S2 normal. No murmurs, rubs, or gallops.  ABDOMEN: Soft, non-tender, non-distended. Bowel sounds present. No organomegaly or mass.  EXTREMITIES: No pedal edema, cyanosis, or clubbing.  NEUROLOGIC: Cranial nerves II through XII are intact. Muscle strength 5/5 in all extremities. Sensation intact. Gait not checked.  PSYCHIATRIC: The patient is alert and oriented x 3.  SKIN: No obvious rash, lesion, or ulcer.   DATA REVIEW:   CBC Recent Labs  Lab 08/29/19 0029  WBC 10.5  HGB 13.2  HCT 40.2  PLT 543*    Chemistries  Recent Labs  Lab 08/29/19 0029  NA 137  K 3.9  CL 101  CO2 26  GLUCOSE 105*  BUN 13  CREATININE 0.97  CALCIUM 8.8*     Microbiology Results  Results for orders placed or performed during the hospital encounter of 08/28/19  SARS Coronavirus 2 by RT PCR (hospital order, performed in [facility] hospital lab) Nasopharyngeal Nasopharyngeal Swab     Status: None   Collection Time: 08/28/19 10:54 AM   Specimen: Nasopharyngeal Swab  Result Value Ref Range Status   SARS Coronavirus 2 NEGATIVE NEGATIVE Final    Comment: (NOTE) SARS-CoV-2 target nucleic acids are NOT DETECTED. The SARS-CoV-2 RNA is generally detectable in upper and lower respiratory specimens during the acute phase of infection. The lowest concentration of SARS-CoV-2 viral copies this assay  can detect is 250 copies / mL. A negative result does not preclude SARS-CoV-2 infection and should not be used as the sole basis for treatment or other patient management decisions.  A negative result may occur with improper specimen collection / handling, submission of specimen other than nasopharyngeal swab, presence of viral mutation(s) within the areas targeted by this assay, and inadequate number of viral copies (<250 copies / mL). A negative result must be combined with clinical observations, patient history, and epidemiological information. Fact Sheet for Patients:   10/28/19 Fact Sheet for Healthcare Providers: BoilerBrush.com.cy This test is not yet approved or cleared  by the https://pope.com/ FDA and has been authorized for detection and/or diagnosis of SARS-CoV-2 by FDA under an Emergency Use Authorization (EUA).  This EUA will remain in effect (meaning this test can be used) for the duration of the COVID-19 declaration under Section 564(b)(1) of the Act, 21 U.S.C. section 360bbb-3(b)(1), unless the authorization is terminated or revoked sooner. Performed at [facility], [address] [place], [place]      Management plans discussed  with the patient, and he is in agreement.  Patient deferred me calling family at this time.  CODE STATUS:     Code Status Orders  (From admission, onward)         Start     Ordered   08/28/19 1337  Full code  Continuous     08/28/19 1336        Code Status History    This patient has a current code status but no historical code status.   Advance Care Planning Activity      TOTAL TIME TAKING CARE OF THIS PATIENT: 35 minutes.    Loletha Grayer M.D on 08/30/2019 at 3:47 PM  Between 7am to 6pm - Pager - [phone]  After 6pm go to [email or web address] - password [email or web address]  [facility]  CC: Primary care physician; Patient, No Pcp Per

## 2019-08-30 NOTE — Progress Notes (Signed)
IV and tele removed from patient. Discharge instructions given to patient. Verbalized understanding. No acute distress at this time. Patient to call for transportation home.  

## 2019-08-30 NOTE — Progress Notes (Addendum)
Progress Note  Patient Name: Bradley Dickerson Date of Encounter: 08/30/2019  Primary Cardiologist: New- Dr. Kirke Corin  Subjective   Patient denies further episodes of chest pain.  Doing okay.  States having flulike/viral illness about 2 weeks ago with symptoms of dry cough, sore throat and fever.  Inpatient Medications    Scheduled Meds: . amoxicillin-clavulanate  1 tablet Oral Q12H  . aspirin  650 mg Oral Q8H  . colchicine  0.6 mg Oral BID  . heparin  5,000 Units Subcutaneous Q8H  . metoprolol tartrate  12.5 mg Oral BID  . pantoprazole  40 mg Oral Daily  . sodium chloride flush  10 mL Intravenous Q12H   Continuous Infusions:  PRN Meds: acetaminophen, morphine injection, nitroGLYCERIN, ondansetron (ZOFRAN) IV, oxyCODONE-acetaminophen   Vital Signs    Vitals:   08/29/19 1624 08/29/19 1918 08/30/19 0446 08/30/19 0740  BP: 109/70 123/73 106/67 114/62  Pulse: 73 77 69 65  Resp: 17 20 20    Temp: 97.7 F (36.5 C) 98.2 F (36.8 C) (!) 97.5 F (36.4 C) 98.3 F (36.8 C)  TempSrc:  Oral Oral Oral  SpO2: 98% 99% 100% 98%  Weight:   70.9 kg   Height:        Intake/Output Summary (Last 24 hours) at 08/30/2019 1137 Last data filed at 08/30/2019 1013 Gross per 24 hour  Intake 720 ml  Output 1100 ml  Net -380 ml   Last 3 Weights 08/30/2019 08/29/2019 08/28/2019  Weight (lbs) 156 lb 3.2 oz 156 lb 3.2 oz 159 lb 12.8 oz  Weight (kg) 70.852 kg 70.852 kg 72.485 kg      Telemetry    Sinus rhythm- Personally Reviewed  ECG    No new ECG obtained- Personally Reviewed  Physical Exam   GEN: No acute distress.   Neck: No JVD Cardiac: RRR, no murmurs, rubs, or gallops.  Respiratory: Clear to auscultation bilaterally. GI: Soft, nontender, non-distended  MS: No edema; No deformity. Neuro:  Nonfocal  Psych: Normal affect   Labs    High Sensitivity Troponin:   Recent Labs  Lab 08/28/19 2214 08/29/19 0029 08/29/19 0748 08/29/19 1406 08/30/19 0437  TROPONINIHS 3,613*  4,737* 6,246* 2,945* 2,975*      Chemistry Recent Labs  Lab 08/28/19 0802 08/29/19 0029  NA 136 137  K 3.5 3.9  CL 103 101  CO2 25 26  GLUCOSE 121* 105*  BUN 14 13  CREATININE 1.07 0.97  CALCIUM 8.7* 8.8*  GFRNONAA >60 >60  GFRAA >60 >60  ANIONGAP 8 10     Hematology Recent Labs  Lab 08/28/19 0802 08/29/19 0029  WBC 12.7* 10.5  RBC 4.65 4.62  HGB 13.2 13.2  HCT 41.2 40.2  MCV 88.6 87.0  MCH 28.4 28.6  MCHC 32.0 32.8  RDW 12.0 12.0  PLT 567* 543*    BNPNo results for input(s): BNP, PROBNP in the last 168 hours.   DDimer No results for input(s): DDIMER in the last 168 hours.   Radiology    ECHOCARDIOGRAM COMPLETE  Result Date: 08/28/2019    ECHOCARDIOGRAM REPORT   Patient Name:   Bradley Dickerson Date of Exam: 08/28/2019 Medical Rec #:  10/28/2019       Height:       66.0 in Accession #:    [id number]      Weight:       160.0 lb Date of Birth:  [birth year]       BSA:  1.819 m Patient Age:    28 years        BP:           123/73 mmHg Patient Gender: M               HR:           83 bpm. Exam Location:  [facility] Procedure: 2D Echo, Color Doppler and Cardiac Doppler Indications:     Acute myocardial infarction 410  History:         Patient has no prior history of Echocardiogram examinations. No                  medical history on file.  Sonographer:     Cristela Blue RDCS (AE) Referring Phys:  [id number] [facility] A ARIDA Diagnosing Phys: Lorine Bears MD  Sonographer Comments: Suboptimal apical window. IMPRESSIONS  1. Left ventricular ejection fraction, by estimation, is 55 to 60%. The left ventricle has normal function. Left ventricular endocardial border not optimally defined to evaluate regional wall motion. Left ventricular diastolic parameters were normal.  2. Right ventricular systolic function is normal. The right ventricular size is normal. There is normal pulmonary artery systolic pressure.  3. The mitral valve is normal in structure. No evidence of mitral valve  regurgitation. No evidence of mitral stenosis.  4. The aortic valve is normal in structure. Aortic valve regurgitation is not visualized. No aortic stenosis is present.  5. The inferior vena cava is normal in size with greater than 50% respiratory variability, suggesting right atrial pressure of 3 mmHg.  6. Trivial pericardial effusion is present. FINDINGS  Left Ventricle: Left ventricular ejection fraction, by estimation, is 55 to 60%. The left ventricle has normal function. Left ventricular endocardial border not optimally defined to evaluate regional wall motion. The left ventricular internal cavity size was normal in size. There is no left ventricular hypertrophy. Left ventricular diastolic parameters were normal. Right Ventricle: The right ventricular size is normal. No increase in right ventricular wall thickness. Right ventricular systolic function is normal. There is normal pulmonary artery systolic pressure. The tricuspid regurgitant velocity is 2.43 m/s, and  with an assumed right atrial pressure of 10 mmHg, the estimated right ventricular systolic pressure is 33.6 mmHg. Left Atrium: Left atrial size was normal in size. Right Atrium: Right atrial size was normal in size. Pericardium: Trivial pericardial effusion is present. The pericardial effusion is circumferential. Mitral Valve: The mitral valve is normal in structure. Normal mobility of the mitral valve leaflets. No evidence of mitral valve regurgitation. No evidence of mitral valve stenosis. Tricuspid Valve: The tricuspid valve is normal in structure. Tricuspid valve regurgitation is trivial. No evidence of tricuspid stenosis. Aortic Valve: The aortic valve is normal in structure. Aortic valve regurgitation is not visualized. No aortic stenosis is present. Aortic valve mean gradient measures 2.5 mmHg. Aortic valve peak gradient measures 4.7 mmHg. Aortic valve area, by VTI measures 2.33 cm. Pulmonic Valve: The pulmonic valve was normal in structure.  Pulmonic valve regurgitation is not visualized. No evidence of pulmonic stenosis. Aorta: The aortic root is normal in size and structure. Venous: The inferior vena cava is normal in size with greater than 50% respiratory variability, suggesting right atrial pressure of 3 mmHg. IAS/Shunts: No atrial level shunt detected by color flow Doppler.  LEFT VENTRICLE PLAX 2D LVIDd:         4.37 cm  Diastology LVIDs:         2.74 cm  LV e' lateral:  12.10 cm/s LV PW:         1.55 cm  LV E/e' lateral: 6.6 LV IVS:        0.73 cm  LV e' medial:    4.90 cm/s LVOT diam:     2.00 cm  LV E/e' medial:  16.3 LV SV:         42 LV SV Index:   23 LVOT Area:     3.14 cm  RIGHT VENTRICLE RV Basal diam:  3.91 cm RV S prime:     13.60 cm/s TAPSE (M-mode): 3.5 cm LEFT ATRIUM           Index       RIGHT ATRIUM           Index LA diam:      2.50 cm 1.37 cm/m  RA Area:     18.70 cm LA Vol (A2C): 24.7 ml 13.58 ml/m RA Volume:   58.50 ml  32.16 ml/m LA Vol (A4C): 18.2 ml 10.01 ml/m  AORTIC VALVE                   PULMONIC VALVE AV Area (Vmax):    2.41 cm    PV Vmax:        0.54 m/s AV Area (Vmean):   2.62 cm    PV Peak grad:   1.2 mmHg AV Area (VTI):     2.33 cm    RVOT Peak grad: 4 mmHg AV Vmax:           108.00 cm/s AV Vmean:          73.000 cm/s AV VTI:            0.179 m AV Peak Grad:      4.7 mmHg AV Mean Grad:      2.5 mmHg LVOT Vmax:         82.90 cm/s LVOT Vmean:        60.900 cm/s LVOT VTI:          0.133 m LVOT/AV VTI ratio: 0.74  AORTA Ao Root diam: 2.50 cm MITRAL VALVE               TRICUSPID VALVE MV Area (PHT): 3.72 cm    TR Peak grad:   23.6 mmHg MV Decel Time: 204 msec    TR Vmax:        243.00 cm/s MV E velocity: 79.70 cm/s MV A velocity: 43.30 cm/s  SHUNTS MV E/A ratio:  1.84        Systemic VTI:  0.13 m                            Systemic Diam: 2.00 cm Lorine Bears MD Electronically signed by Lorine Bears MD Signature Date/Time: 08/28/2019/1:10:38 PM    Final     Cardiac Studies   TTE 08/2019 1. Left  ventricular ejection fraction, by estimation, is 55 to 60%. The  left ventricle has normal function. Left ventricular endocardial border  not optimally defined to evaluate regional wall motion. Left ventricular  diastolic parameters were normal.  2. Right ventricular systolic function is normal. The right ventricular  size is normal. There is normal pulmonary artery systolic pressure.  3. The mitral valve is normal in structure. No evidence of mitral valve  regurgitation. No evidence of mitral stenosis.  4. The aortic valve is normal in structure. Aortic valve regurgitation is  not visualized.  No aortic stenosis is present.  5. The inferior vena cava is normal in size with greater than 50%  respiratory variability, suggesting right atrial pressure of 3 mmHg.  6. Trivial pericardial effusion is present.   Patient Profile     29 y.o. male with no significant past medical history presenting with chest pain, recent flulike illness and sore throat.  Found to have elevated troponins and trivial effusion.  Being seen for elevated troponins.  Assessment & Plan    1.  Atypical chest pain -Currently chest pain-free -Trivial pericardial effusion, elevated troponins. -Clinical sequelae and testing consistent with a myocarditis or myopericarditis. -Troponins peaked at 6246. -Patient declined heart cart to evaluate obstructive disease. -Continue colchicine twice daily for 3 months aspirin 625 3 times daily for at least 1 week patient currently asymptomatic.  Ideally, taper by decrease in dose to twice daily the following week, daily third week then stop.  -Follow-up as outpatient.  Recommend cardiac MRI as outpatient to evaluate for inflammation/scar.  2.  History of sore throat, -Abnormal streptolysin antibodies -Antibiotics as per medicine team -Follow-up streptolysin antibodies could be performed after treatment to confirm it is decreasing.  Total encounter time 35 minutes  Greater than  50% was spent in counseling and coordination of care with the patient and staff     Signed, Kate Sable, MD  08/30/2019, 11:37 AM

## 2019-08-30 NOTE — Progress Notes (Signed)
Patient ID: Bradley Dickerson [facility] [place] [facility] was admitted to the Hospital on 08/28/2019 and Discharged  08/30/2019 and should be excused from work/school   for 6 days starting 08/28/2019 , may return to work/school on 09/04/2019 without any restrictions.   Alford Highland M.D on 08/30/2019,at 9:28 AM  [facility] [place] [facility]

## 2019-08-31 ENCOUNTER — Telehealth: Payer: Self-pay | Admitting: General Practice

## 2019-08-31 NOTE — Telephone Encounter (Signed)
I called Mr. Wildes to explain eligibility requirements and application process for [facility]. We were able to speak and I answered the few questions he had

## 2019-12-13 ENCOUNTER — Telehealth: Payer: Self-pay | Admitting: Pharmacist

## 2019-12-13 NOTE — Telephone Encounter (Signed)
Patient failed to provide requested 2021 financial documentation. No additional medication assistance will be provided by [facility] without the required proof of income documentation. Patient notified by letter Debra Cheek Administrative Assistant [facility] 

## 2021-08-21 ENCOUNTER — Ambulatory Visit (LOCAL_COMMUNITY_HEALTH_CENTER): Payer: Self-pay

## 2021-08-21 DIAGNOSIS — Z719 Counseling, unspecified: Secondary | ICD-10-CM

## 2021-08-21 DIAGNOSIS — Z23 Encounter for immunization: Secondary | ICD-10-CM

## 2021-08-21 NOTE — Progress Notes (Signed)
?  Are you feeling sick today? No ? ? ?Have you ever received a dose of COVID-19 Vaccine? [employer], [place], [place], Novavax, Other) No ? ?If yes, which vaccine and how many doses?    ? ? ?Did you bring the vaccination record card or other documentation?  No ? ? ?Do you have a health condition or are undergoing treatment that makes you moderately or severely immunocompromised? This would include, but not be limited to: cancer, HIV, organ transplant, immunosuppressive therapy/high-dose corticosteroids, or moderate/severe primary immunodeficiency.  No ? ?Have you received COVID-19 vaccine before or during hematopoietic cell transplant (HCT) or CAR-T-cell therapies? No ? ?Have you ever had an allergic reaction to: (This would include a severe allergic reaction or a reaction that caused hives, swelling, or respiratory distress, including wheezing.) A component of a COVID-19 vaccine or a previous dose of COVID-19 vaccine? No ? ? ?Have you ever had an allergic reaction to another vaccine (other thanCOVID-19 vaccine) or an injectable medication? (This would include a severe allergic reaction or a reaction that caused hives, swelling, or respiratory distress, including wheezing.)   No ?  ?Do you have a history of any of the following: ? ?Myocarditis or Pericarditis Yes ?Thrombosis with thrombocytopenia syndrome (TTS) No ?Multisystem Inflammatory Syndrome (MIS-C or MIS-A)? No ?Immune-mediate syndrome defined by thrombosis and thrombocytopenia, such as heparin--induced thrombocytopenia (HIT)  No ?Guillain-Barr? Syndrome (GBS) No ?COVID-19 disease within the past 3 months? No ?Vaccinated with monkeypox vaccine in the last 4 weeks? No ?  ?Patient seen for COVID vaccination.  Pfizer BV administer in left deltoid.  Patient has history Myocarditis and Dr. Ernestina Patches called and said it was OK to administer.   COVID card provided.  [facility] updated and copy provided. Patient waited 15 minutes after vaccination since 1st time receiving  vaccine. ?

## 2021-08-28 IMAGING — CR DG CHEST 2V
1 series · 2 of 2 positions shown · non-contrast
Comparison: None.

CLINICAL DATA: Chest pain

EXAM:
CHEST - 2 VIEW

[Series 1: dg chest 2 view · 0.14mm/px · 2 of 2 slices shown]
[im 1/2]
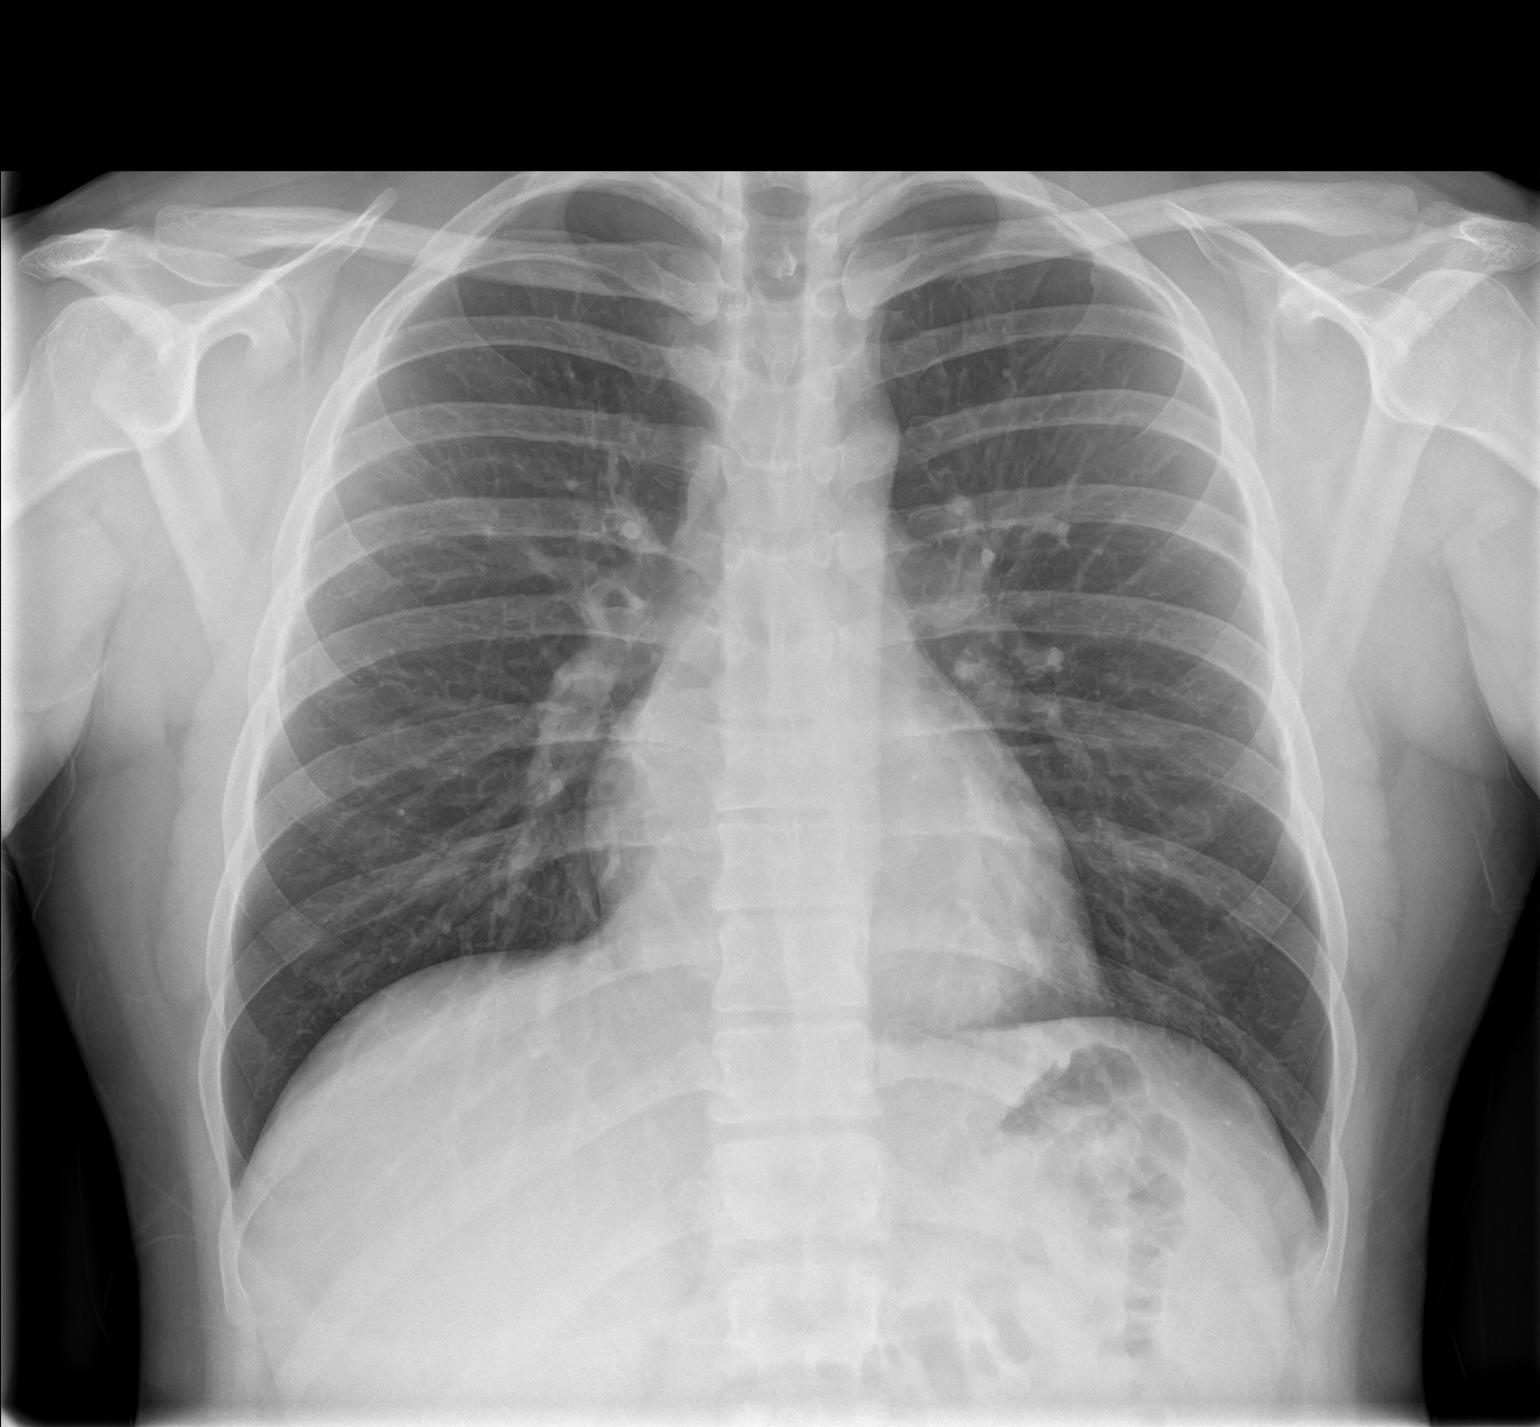
[im 2/2]
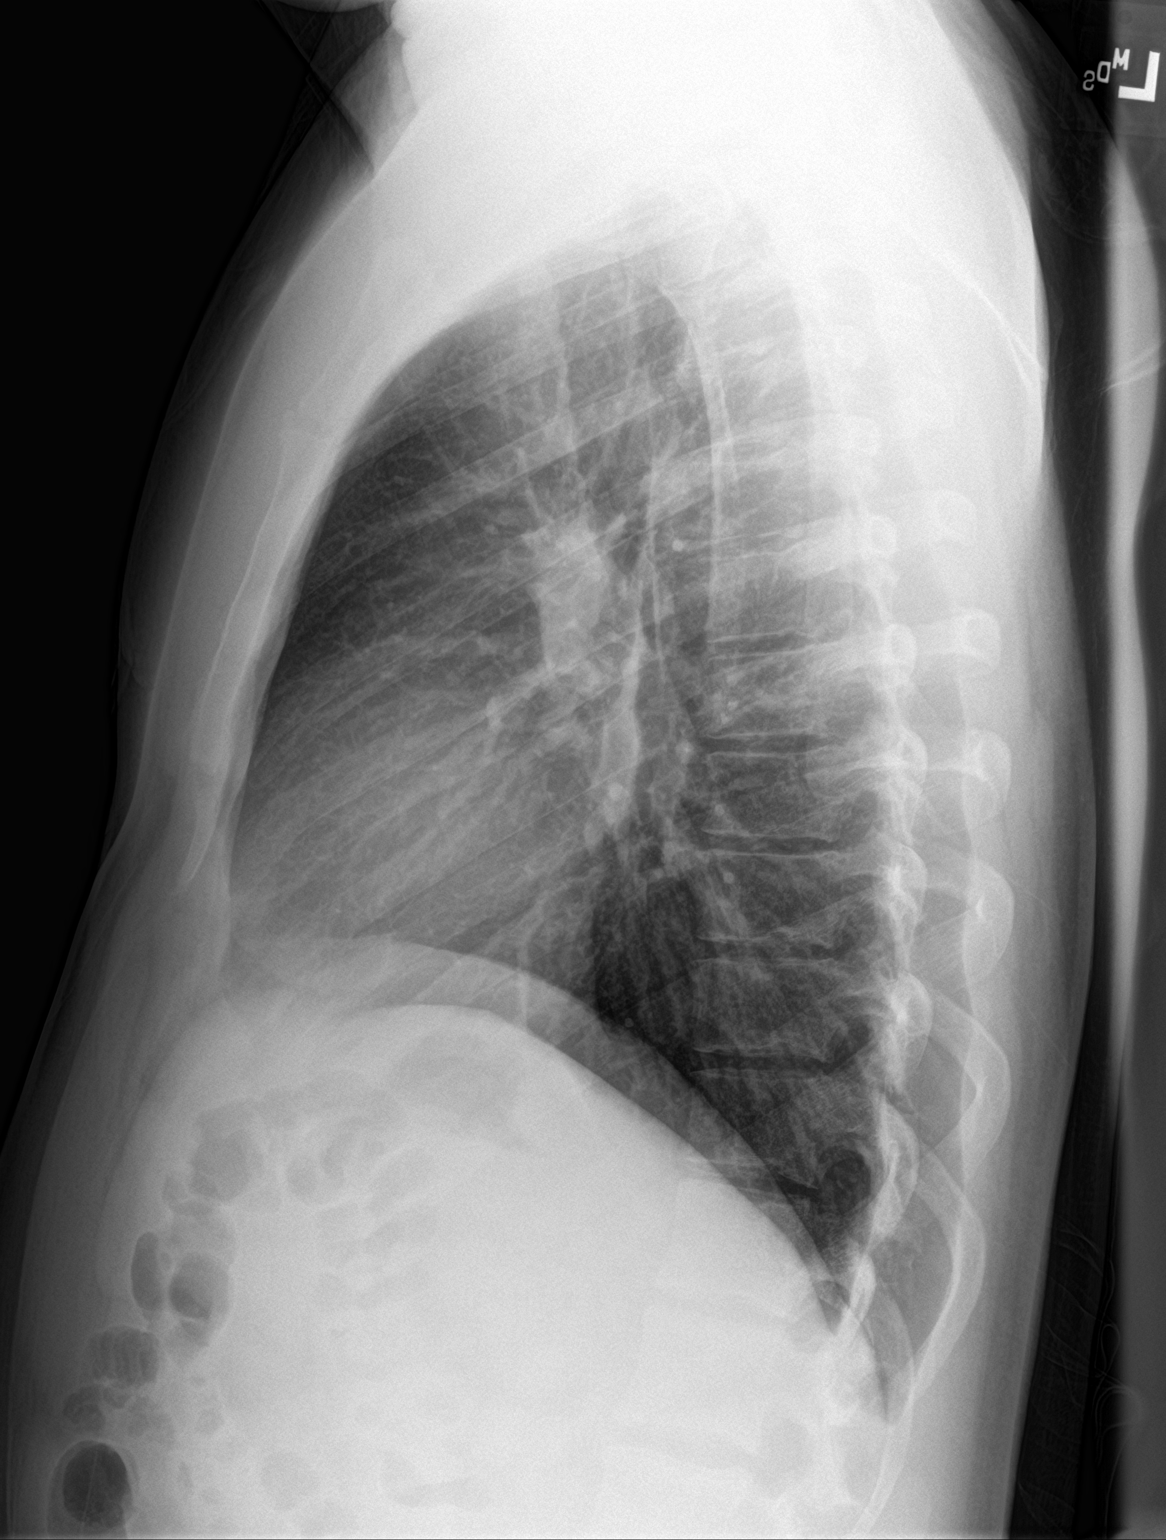

[2 of 2 positions shown; findings below may reference images not displayed]

FINDINGS: The heart size and mediastinal contours are within normal limits.
Both lungs are clear. The visualized skeletal structures are
unremarkable.
IMPRESSION: Normal study.

## 2022-02-16 ENCOUNTER — Ambulatory Visit
Admission: EM | Admit: 2022-02-16 | Discharge: 2022-02-16 | Disposition: A | Discharge status: Home / Self Care | Payer: Self-pay | Attending: Urgent Care | Admitting: Urgent Care

## 2022-02-16 ENCOUNTER — Encounter: Payer: Self-pay | Admitting: Emergency Medicine

## 2022-02-16 DIAGNOSIS — L237 Allergic contact dermatitis due to plants, except food: Secondary | ICD-10-CM

## 2022-02-16 MED ORDER — METHYLPREDNISOLONE SODIUM SUCC 125 MG IJ SOLR
125.0000 mg | Freq: Once | INTRAMUSCULAR | Status: AC
  Administered 2022-02-16: 125 mg via INTRAMUSCULAR

## 2022-02-16 MED ORDER — PREDNISONE 50 MG PO TABS: 50.0000 mg | ORAL_TABLET | Freq: Every day | ORAL | 0 refills | Status: AC

## 2022-02-16 NOTE — ED Triage Notes (Signed)
Pt presents with a itchy rash all over his body x 2 days. Pt has tried OTC cream and benadryl with no relief

## 2022-02-16 NOTE — Discharge Instructions (Signed)
You have a contact dermatitis. You were given a steroid shot in our office. Please start taking the prednisone tablet tomorrow morning with breakfast. Take until completed. You may take 25-50mg  of benadryl every 8-12 hours as needed as well, however take with caution as this can make you drowsy.

## 2022-02-16 NOTE — ED Provider Notes (Signed)
[facility]    CSN: [id number] Arrival date & time: 02/16/22  1311      History   Chief Complaint Chief Complaint  Patient presents with   Rash    HPI Bradley Dickerson is a 31 y.o. male.   31 year old male presents to due to concerns of a rash to the back of his neck, on his hands, and to his upper thighs.  States it started yesterday after doing yard work.  He believes he come in contact with plants.  He has not tried any over-the-counter medications for his symptoms.  His concern was because it started on his face this morning, primarily around his right eye.  He denies any change in vision or swelling of his lids.  Denies any swelling of his lips, throat, dysphagia, shortness of breath or wheezing.   Rash   History reviewed. No pertinent past medical history.  Patient Active Problem List   Diagnosis Date Noted   NSTEMI (non-ST elevated myocardial infarction) ([place])    Acute myocarditis    Sore throat    Chest pain 08/28/2019   Leukocytosis 08/28/2019   Elevated troponin 08/28/2019   Acute idiopathic myocarditis     Past Surgical History:  Procedure Laterality Date   dental procedure     4 wisdom teeth pulled out       Home Medications    Prior to Admission medications   Medication Sig Start Date End Date Taking? Authorizing Provider  predniSONE (DELTASONE) 50 MG tablet Take 1 tablet (50 mg total) by mouth daily with breakfast. 02/16/22  Yes Zayvon Alicea L, PA    Family History Family History  Problem Relation Age of Onset   Hyperlipidemia Mother    CAD Mother     Social History Social History   Tobacco Use   Smoking status: Never   Smokeless tobacco: Never  Vaping Use   Vaping Use: Never used  Substance Use Topics   Alcohol use: Yes    Alcohol/week: 6.0 standard drinks of alcohol    Types: 6 Cans of beer per week   Drug use: No     Allergies   Patient has no known allergies.   Review of Systems Review of Systems  Skin:   Positive for rash.  As per HPI   Physical Exam Triage Vital Signs ED Triage Vitals  Enc Vitals Group     BP 02/16/22 1401 130/86     Pulse Rate 02/16/22 1401 63     Resp 02/16/22 1401 16     Temp 02/16/22 1401 98.5 F (36.9 C)     Temp Source 02/16/22 1401 Oral     SpO2 02/16/22 1401 100 %     Weight --      Height --      Head Circumference --      Peak Flow --      Pain Score 02/16/22 1400 0     Pain Loc --      Pain Edu? --      Excl. in [place]? --    No data found.  Updated Vital Signs BP 130/86 (BP Location: Left Arm)   Pulse 63   Temp 98.5 F (36.9 C) (Oral)   Resp 16   SpO2 100%   Visual Acuity Right Eye Distance:   Left Eye Distance:   Bilateral Distance:    Right Eye Near:   Left Eye Near:    Bilateral Near:     Physical Exam  Vitals and nursing note reviewed.  Constitutional:      General: He is not in acute distress.    Appearance: Normal appearance. He is well-developed. He is not ill-appearing or diaphoretic.  HENT:     Head: Normocephalic and atraumatic.     Mouth/Throat:     Mouth: Mucous membranes are moist.     Pharynx: Oropharynx is clear. No oropharyngeal exudate or posterior oropharyngeal erythema.  Eyes:     General:        Right eye: No discharge.        Left eye: No discharge.     Extraocular Movements: Extraocular movements intact.     Conjunctiva/sclera: Conjunctivae normal.     Pupils: Pupils are equal, round, and reactive to light.  Cardiovascular:     Rate and Rhythm: Normal rate and regular rhythm.  Pulmonary:     Effort: Pulmonary effort is normal. No respiratory distress.     Breath sounds: Normal breath sounds. No stridor. No wheezing, rhonchi or rales.  Musculoskeletal:        General: No swelling.     Cervical back: Normal range of motion and neck supple.  Lymphadenopathy:     Cervical: No cervical adenopathy.  Skin:    General: Skin is warm and dry.     Capillary Refill: Capillary refill takes less than 2 seconds.      Findings: Rash (raised erythematous patches of vesicles and papules, primarily to nape of neck, around R eye, on R dorsal hand) present.  Neurological:     General: No focal deficit present.     Mental Status: He is alert and oriented to person, place, and time.     Sensory: No sensory deficit.  Psychiatric:        Mood and Affect: Mood normal.      UC Treatments / Results  Labs (all labs ordered are listed, but only abnormal results are displayed) Labs Reviewed - No data to display  EKG   Radiology No results found.  Procedures Procedures (including critical care time)  Medications Ordered in UC Medications  methylPREDNISolone sodium succinate (SOLU-MEDROL) 125 mg/2 mL injection 125 mg (125 mg Intramuscular Given 02/16/22 1423)    Initial Impression / Assessment and Plan / UC Course  I have reviewed the triage vital signs and the nursing notes.  Pertinent labs & imaging results that were available during my care of the patient were reviewed by me and considered in my medical decision making (see chart for details).     Acute contact dermatitis -likely secondary to plants.  Rash appears very allergic in nature.  IM Solu-Medrol given in office, discharged home with prednisone and hydroxyzine.  Return to clinic precautions reviewed with patient.   Final Clinical Impressions(s) / UC Diagnoses   Final diagnoses:  Allergic contact dermatitis due to plants, except food     Discharge Instructions      You have a contact dermatitis. You were given a steroid shot in our office. Please start taking the prednisone tablet tomorrow morning with breakfast. Take until completed. You may take 25-50mg  of benadryl every 8-12 hours as needed as well, however take with caution as this can make you drowsy.     ED Prescriptions     Medication Sig Dispense Auth. Provider   predniSONE (DELTASONE) 50 MG tablet Take 1 tablet (50 mg total) by mouth daily with breakfast. 5  tablet Vaanya Shambaugh L, PA      PDMP not reviewed this  encounter.   Maretta Bees, [place] 02/16/22 1503
